# Patient Record
Sex: Female | Born: 1935 | Race: White | Hispanic: No | Marital: Married | Smoking: Never smoker
Health system: Southern US, Community
[De-identification: ages and names within clinical notes are randomized; demographics above are authoritative.]

## PROBLEM LIST (undated history)

## (undated) DIAGNOSIS — Z9221 Personal history of antineoplastic chemotherapy: Secondary | ICD-10-CM

## (undated) DIAGNOSIS — Z923 Personal history of irradiation: Secondary | ICD-10-CM

## (undated) DIAGNOSIS — C50919 Malignant neoplasm of unspecified site of unspecified female breast: Secondary | ICD-10-CM

## (undated) DIAGNOSIS — IMO0002 Reserved for concepts with insufficient information to code with codable children: Secondary | ICD-10-CM

## (undated) HISTORY — PX: BREAST LUMPECTOMY: SHX2

---

## 2004-12-31 ENCOUNTER — Ambulatory Visit: Payer: Self-pay | Admitting: Family Medicine

## 2006-01-14 ENCOUNTER — Ambulatory Visit: Payer: Self-pay | Admitting: Unknown Physician Specialty

## 2006-08-03 ENCOUNTER — Ambulatory Visit: Payer: Self-pay | Admitting: General Practice

## 2006-08-16 ENCOUNTER — Ambulatory Visit: Payer: Self-pay | Admitting: Pain Medicine

## 2006-08-23 ENCOUNTER — Ambulatory Visit: Payer: Self-pay | Admitting: Pain Medicine

## 2006-08-24 ENCOUNTER — Ambulatory Visit: Payer: Self-pay | Admitting: Pain Medicine

## 2006-09-07 ENCOUNTER — Ambulatory Visit: Payer: Self-pay | Admitting: Pain Medicine

## 2006-09-21 ENCOUNTER — Ambulatory Visit: Payer: Self-pay | Admitting: Pain Medicine

## 2006-10-05 ENCOUNTER — Ambulatory Visit: Payer: Self-pay | Admitting: Physician Assistant

## 2006-12-01 ENCOUNTER — Ambulatory Visit: Payer: Self-pay

## 2007-01-18 ENCOUNTER — Ambulatory Visit: Payer: Self-pay | Admitting: Unknown Physician Specialty

## 2007-06-09 ENCOUNTER — Ambulatory Visit: Payer: Self-pay | Admitting: General Practice

## 2007-06-09 ENCOUNTER — Other Ambulatory Visit: Payer: Self-pay

## 2007-06-22 ENCOUNTER — Ambulatory Visit: Payer: Self-pay | Admitting: General Practice

## 2008-01-19 ENCOUNTER — Ambulatory Visit: Payer: Self-pay | Admitting: Unknown Physician Specialty

## 2008-03-27 ENCOUNTER — Ambulatory Visit: Payer: Self-pay | Admitting: Family Medicine

## 2009-03-08 ENCOUNTER — Ambulatory Visit: Payer: Self-pay | Admitting: Unknown Physician Specialty

## 2009-03-19 ENCOUNTER — Ambulatory Visit: Payer: Self-pay | Admitting: Unknown Physician Specialty

## 2009-06-22 DIAGNOSIS — Z9221 Personal history of antineoplastic chemotherapy: Secondary | ICD-10-CM

## 2009-06-22 DIAGNOSIS — Z923 Personal history of irradiation: Secondary | ICD-10-CM

## 2009-06-22 DIAGNOSIS — C50919 Malignant neoplasm of unspecified site of unspecified female breast: Secondary | ICD-10-CM

## 2009-06-22 DIAGNOSIS — IMO0001 Reserved for inherently not codable concepts without codable children: Secondary | ICD-10-CM

## 2009-06-22 HISTORY — DX: Reserved for inherently not codable concepts without codable children: IMO0001

## 2009-06-22 HISTORY — DX: Personal history of antineoplastic chemotherapy: Z92.21

## 2009-06-22 HISTORY — DX: Malignant neoplasm of unspecified site of unspecified female breast: C50.919

## 2009-06-22 HISTORY — PX: BREAST BIOPSY: SHX20

## 2009-06-22 HISTORY — DX: Personal history of irradiation: Z92.3

## 2009-09-23 ENCOUNTER — Ambulatory Visit: Payer: Self-pay | Admitting: Unknown Physician Specialty

## 2009-10-03 ENCOUNTER — Ambulatory Visit: Payer: Self-pay | Admitting: Unknown Physician Specialty

## 2009-10-23 ENCOUNTER — Ambulatory Visit: Payer: Self-pay | Admitting: Surgery

## 2009-10-31 ENCOUNTER — Ambulatory Visit: Payer: Self-pay | Admitting: Surgery

## 2009-11-04 ENCOUNTER — Ambulatory Visit: Payer: Self-pay | Admitting: Surgery

## 2009-11-06 ENCOUNTER — Ambulatory Visit: Payer: Self-pay | Admitting: Cardiovascular Disease

## 2009-11-08 ENCOUNTER — Ambulatory Visit: Payer: Self-pay | Admitting: Surgery

## 2009-11-20 ENCOUNTER — Ambulatory Visit: Payer: Self-pay | Admitting: Oncology

## 2009-12-20 ENCOUNTER — Ambulatory Visit: Payer: Self-pay | Admitting: Oncology

## 2010-01-20 ENCOUNTER — Ambulatory Visit: Payer: Self-pay | Admitting: Oncology

## 2010-02-20 ENCOUNTER — Ambulatory Visit: Payer: Self-pay | Admitting: Oncology

## 2010-03-22 ENCOUNTER — Ambulatory Visit: Payer: Self-pay | Admitting: Oncology

## 2010-04-12 LAB — CANCER ANTIGEN 27.29: CA 27.29: 12.1 U/mL (ref 0.0–38.6)

## 2010-04-22 ENCOUNTER — Ambulatory Visit: Payer: Self-pay | Admitting: Oncology

## 2010-05-22 ENCOUNTER — Ambulatory Visit: Payer: Self-pay | Admitting: Oncology

## 2010-06-07 LAB — CANCER ANTIGEN 27.29: CA 27.29: 23.2 U/mL (ref 0.0–38.6)

## 2010-06-22 ENCOUNTER — Ambulatory Visit: Payer: Self-pay | Admitting: Oncology

## 2010-06-22 HISTORY — PX: BREAST BIOPSY: SHX20

## 2010-07-23 ENCOUNTER — Ambulatory Visit: Payer: Self-pay | Admitting: Oncology

## 2010-10-08 ENCOUNTER — Ambulatory Visit: Payer: Self-pay | Admitting: Surgery

## 2010-12-10 ENCOUNTER — Ambulatory Visit: Payer: Self-pay | Admitting: Oncology

## 2010-12-13 LAB — CANCER ANTIGEN 27.29: CA 27.29: 11.8 U/mL (ref 0.0–38.6)

## 2010-12-21 ENCOUNTER — Ambulatory Visit: Payer: Self-pay | Admitting: Oncology

## 2011-04-22 ENCOUNTER — Ambulatory Visit: Payer: Self-pay | Admitting: Surgery

## 2011-06-11 ENCOUNTER — Ambulatory Visit: Payer: Self-pay | Admitting: Oncology

## 2011-06-23 ENCOUNTER — Ambulatory Visit: Payer: Self-pay | Admitting: Oncology

## 2011-10-12 ENCOUNTER — Ambulatory Visit: Payer: Self-pay | Admitting: Surgery

## 2011-12-10 ENCOUNTER — Ambulatory Visit: Payer: Self-pay | Admitting: Oncology

## 2011-12-17 LAB — COMPREHENSIVE METABOLIC PANEL
Albumin: 3.7 g/dL (ref 3.4–5.0)
Bilirubin,Total: 0.3 mg/dL (ref 0.2–1.0)
Calcium, Total: 9.5 mg/dL (ref 8.5–10.1)
EGFR (African American): 40 — ABNORMAL LOW
SGOT(AST): 33 U/L (ref 15–37)
SGPT (ALT): 55 U/L
Sodium: 143 mmol/L (ref 136–145)
Total Protein: 6.7 g/dL (ref 6.4–8.2)

## 2011-12-17 LAB — CBC CANCER CENTER
Basophil %: 0.5 %
Eosinophil #: 0.1 x10 3/mm (ref 0.0–0.7)
Lymphocyte #: 1.7 x10 3/mm (ref 1.0–3.6)
Lymphocyte %: 45.4 %
MCV: 101 fL — ABNORMAL HIGH (ref 80–100)
Monocyte %: 8.7 %
Neutrophil %: 43.2 %
Platelet: 137 x10 3/mm — ABNORMAL LOW (ref 150–440)
RBC: 4.23 10*6/uL (ref 3.80–5.20)

## 2011-12-18 LAB — CANCER ANTIGEN 27.29: CA 27.29: 18.4 U/mL (ref 0.0–38.6)

## 2011-12-21 ENCOUNTER — Ambulatory Visit: Payer: Self-pay | Admitting: Oncology

## 2012-06-09 ENCOUNTER — Ambulatory Visit: Payer: Self-pay | Admitting: Oncology

## 2012-06-09 LAB — CBC CANCER CENTER
Basophil #: 0 x10 3/mm (ref 0.0–0.1)
Basophil %: 0.7 %
Eosinophil #: 0.1 x10 3/mm (ref 0.0–0.7)
Eosinophil %: 2.9 %
HGB: 14.7 g/dL (ref 12.0–16.0)
Lymphocyte %: 46.3 %
MCHC: 33.7 g/dL (ref 32.0–36.0)
Neutrophil %: 40.5 %
RBC: 4.42 10*6/uL (ref 3.80–5.20)
RDW: 13.8 % (ref 11.5–14.5)

## 2012-06-09 LAB — COMPREHENSIVE METABOLIC PANEL
Albumin: 3.8 g/dL (ref 3.4–5.0)
BUN: 18 mg/dL (ref 7–18)
Bilirubin,Total: 0.3 mg/dL (ref 0.2–1.0)
Chloride: 106 mmol/L (ref 98–107)
Co2: 29 mmol/L (ref 21–32)
Creatinine: 1.37 mg/dL — ABNORMAL HIGH (ref 0.60–1.30)
EGFR (African American): 42 — ABNORMAL LOW
Glucose: 85 mg/dL (ref 65–99)
Potassium: 4.2 mmol/L (ref 3.5–5.1)
SGOT(AST): 31 U/L (ref 15–37)
SGPT (ALT): 52 U/L (ref 12–78)
Total Protein: 7 g/dL (ref 6.4–8.2)

## 2012-06-10 LAB — CANCER ANTIGEN 27.29: CA 27.29: 24 U/mL (ref 0.0–38.6)

## 2012-06-22 ENCOUNTER — Ambulatory Visit: Payer: Self-pay | Admitting: Oncology

## 2012-10-12 ENCOUNTER — Ambulatory Visit: Payer: Self-pay | Admitting: Oncology

## 2012-11-20 ENCOUNTER — Ambulatory Visit: Payer: Self-pay | Admitting: Oncology

## 2012-12-08 LAB — COMPREHENSIVE METABOLIC PANEL
Albumin: 3.7 g/dL (ref 3.4–5.0)
Alkaline Phosphatase: 68 U/L (ref 50–136)
BUN: 21 mg/dL — ABNORMAL HIGH (ref 7–18)
Bilirubin,Total: 0.4 mg/dL (ref 0.2–1.0)
Calcium, Total: 9.9 mg/dL (ref 8.5–10.1)
Chloride: 106 mmol/L (ref 98–107)
Co2: 31 mmol/L (ref 21–32)
EGFR (Non-African Amer.): 25 — ABNORMAL LOW
Osmolality: 284 (ref 275–301)
Potassium: 4.6 mmol/L (ref 3.5–5.1)
SGPT (ALT): 49 U/L (ref 12–78)
Total Protein: 6.7 g/dL (ref 6.4–8.2)

## 2012-12-08 LAB — CBC CANCER CENTER
Basophil #: 0 x10 3/mm (ref 0.0–0.1)
HCT: 41.4 % (ref 35.0–47.0)
HGB: 14.2 g/dL (ref 12.0–16.0)
Lymphocyte #: 1.9 x10 3/mm (ref 1.0–3.6)
Lymphocyte %: 49 %
MCH: 33.6 pg (ref 26.0–34.0)
MCHC: 34.3 g/dL (ref 32.0–36.0)
MCV: 98 fL (ref 80–100)
Neutrophil #: 1.6 x10 3/mm (ref 1.4–6.5)
Neutrophil %: 40.7 %
Platelet: 144 x10 3/mm — ABNORMAL LOW (ref 150–440)
RBC: 4.23 10*6/uL (ref 3.80–5.20)
WBC: 3.9 x10 3/mm (ref 3.6–11.0)

## 2012-12-20 ENCOUNTER — Ambulatory Visit: Payer: Self-pay | Admitting: Oncology

## 2013-06-26 ENCOUNTER — Ambulatory Visit: Payer: Self-pay | Admitting: Radiation Oncology

## 2013-06-27 LAB — CBC CANCER CENTER
Basophil #: 0 x10 3/mm (ref 0.0–0.1)
Basophil %: 0.6 %
EOS ABS: 0.1 x10 3/mm (ref 0.0–0.7)
Eosinophil %: 2.1 %
HCT: 44.4 % (ref 35.0–47.0)
HGB: 14.5 g/dL (ref 12.0–16.0)
LYMPHS ABS: 1.8 x10 3/mm (ref 1.0–3.6)
LYMPHS PCT: 43.3 %
MCH: 32.6 pg (ref 26.0–34.0)
MCHC: 32.7 g/dL (ref 32.0–36.0)
MCV: 100 fL (ref 80–100)
Monocyte #: 0.3 x10 3/mm (ref 0.2–0.9)
Monocyte %: 8.3 %
NEUTROS ABS: 1.9 x10 3/mm (ref 1.4–6.5)
NEUTROS PCT: 45.7 %
Platelet: 152 x10 3/mm (ref 150–440)
RBC: 4.45 10*6/uL (ref 3.80–5.20)
RDW: 13.8 % (ref 11.5–14.5)
WBC: 4.1 x10 3/mm (ref 3.6–11.0)

## 2013-06-27 LAB — COMPREHENSIVE METABOLIC PANEL
ALK PHOS: 57 U/L
ALT: 43 U/L (ref 12–78)
Albumin: 3.6 g/dL (ref 3.4–5.0)
Anion Gap: 5 — ABNORMAL LOW (ref 7–16)
BUN: 18 mg/dL (ref 7–18)
Bilirubin,Total: 0.3 mg/dL (ref 0.2–1.0)
CO2: 31 mmol/L (ref 21–32)
Calcium, Total: 10.1 mg/dL (ref 8.5–10.1)
Chloride: 106 mmol/L (ref 98–107)
Creatinine: 1.58 mg/dL — ABNORMAL HIGH (ref 0.60–1.30)
EGFR (Non-African Amer.): 30 — ABNORMAL LOW
GFR CALC AF AMER: 35 — AB
Glucose: 96 mg/dL (ref 65–99)
Osmolality: 285 (ref 275–301)
POTASSIUM: 4.6 mmol/L (ref 3.5–5.1)
SGOT(AST): 29 U/L (ref 15–37)
Sodium: 142 mmol/L (ref 136–145)
TOTAL PROTEIN: 6.9 g/dL (ref 6.4–8.2)

## 2013-06-28 LAB — CANCER ANTIGEN 27.29: CA 27.29: 20.2 U/mL (ref 0.0–38.6)

## 2013-07-23 ENCOUNTER — Ambulatory Visit: Payer: Self-pay | Admitting: Radiation Oncology

## 2013-10-17 ENCOUNTER — Ambulatory Visit: Payer: Self-pay | Admitting: Oncology

## 2013-11-28 ENCOUNTER — Ambulatory Visit: Payer: Self-pay | Admitting: General Practice

## 2013-12-26 ENCOUNTER — Ambulatory Visit: Payer: Self-pay | Admitting: Oncology

## 2013-12-26 LAB — COMPREHENSIVE METABOLIC PANEL
ALK PHOS: 53 U/L
ALT: 37 U/L (ref 12–78)
ANION GAP: 5 — AB (ref 7–16)
Albumin: 3.8 g/dL (ref 3.4–5.0)
BUN: 19 mg/dL — AB (ref 7–18)
Bilirubin,Total: 0.4 mg/dL (ref 0.2–1.0)
CHLORIDE: 107 mmol/L (ref 98–107)
CREATININE: 1.66 mg/dL — AB (ref 0.60–1.30)
Calcium, Total: 10 mg/dL (ref 8.5–10.1)
Co2: 31 mmol/L (ref 21–32)
EGFR (African American): 33 — ABNORMAL LOW
GFR CALC NON AF AMER: 28 — AB
Glucose: 99 mg/dL (ref 65–99)
Osmolality: 287 (ref 275–301)
POTASSIUM: 4.9 mmol/L (ref 3.5–5.1)
SGOT(AST): 27 U/L (ref 15–37)
SODIUM: 143 mmol/L (ref 136–145)
Total Protein: 6.7 g/dL (ref 6.4–8.2)

## 2013-12-26 LAB — CBC CANCER CENTER
Basophil #: 0 x10 3/mm (ref 0.0–0.1)
Basophil %: 0.5 %
EOS ABS: 0.1 x10 3/mm (ref 0.0–0.7)
Eosinophil %: 2 %
HCT: 43.1 % (ref 35.0–47.0)
HGB: 14.2 g/dL (ref 12.0–16.0)
LYMPHS ABS: 2 x10 3/mm (ref 1.0–3.6)
Lymphocyte %: 50.3 %
MCH: 32.5 pg (ref 26.0–34.0)
MCHC: 33 g/dL (ref 32.0–36.0)
MCV: 98 fL (ref 80–100)
MONOS PCT: 7.6 %
Monocyte #: 0.3 x10 3/mm (ref 0.2–0.9)
Neutrophil #: 1.6 x10 3/mm (ref 1.4–6.5)
Neutrophil %: 39.6 %
Platelet: 136 x10 3/mm — ABNORMAL LOW (ref 150–440)
RBC: 4.38 10*6/uL (ref 3.80–5.20)
RDW: 14 % (ref 11.5–14.5)
WBC: 4 x10 3/mm (ref 3.6–11.0)

## 2014-01-10 DIAGNOSIS — M5136 Other intervertebral disc degeneration, lumbar region: Secondary | ICD-10-CM | POA: Insufficient documentation

## 2014-01-10 DIAGNOSIS — M48061 Spinal stenosis, lumbar region without neurogenic claudication: Secondary | ICD-10-CM | POA: Insufficient documentation

## 2014-01-10 DIAGNOSIS — M5116 Intervertebral disc disorders with radiculopathy, lumbar region: Secondary | ICD-10-CM | POA: Insufficient documentation

## 2014-01-20 ENCOUNTER — Ambulatory Visit: Payer: Self-pay | Admitting: Oncology

## 2014-04-18 ENCOUNTER — Ambulatory Visit: Payer: Self-pay | Admitting: General Practice

## 2014-04-18 LAB — SEDIMENTATION RATE: Erythrocyte Sed Rate: 12 mm/hr (ref 0–30)

## 2014-04-18 LAB — CBC
HCT: 42.1 % (ref 35.0–47.0)
HGB: 13.8 g/dL (ref 12.0–16.0)
MCH: 32.5 pg (ref 26.0–34.0)
MCHC: 32.7 g/dL (ref 32.0–36.0)
MCV: 99 fL (ref 80–100)
Platelet: 154 10*3/uL (ref 150–440)
RBC: 4.24 10*6/uL (ref 3.80–5.20)
RDW: 14 % (ref 11.5–14.5)
WBC: 4 10*3/uL (ref 3.6–11.0)

## 2014-04-18 LAB — BASIC METABOLIC PANEL
Anion Gap: 5 — ABNORMAL LOW (ref 7–16)
BUN: 14 mg/dL (ref 7–18)
CALCIUM: 9.7 mg/dL (ref 8.5–10.1)
CHLORIDE: 106 mmol/L (ref 98–107)
Co2: 32 mmol/L (ref 21–32)
Creatinine: 1.24 mg/dL (ref 0.60–1.30)
EGFR (Non-African Amer.): 44 — ABNORMAL LOW
GFR CALC AF AMER: 53 — AB
Glucose: 151 mg/dL — ABNORMAL HIGH (ref 65–99)
Osmolality: 288 (ref 275–301)
POTASSIUM: 3.8 mmol/L (ref 3.5–5.1)
SODIUM: 143 mmol/L (ref 136–145)

## 2014-04-18 LAB — URINALYSIS, COMPLETE
BILIRUBIN, UR: NEGATIVE
BLOOD: NEGATIVE
Bacteria: NONE SEEN
GLUCOSE, UR: NEGATIVE mg/dL (ref 0–75)
Ketone: NEGATIVE
Nitrite: NEGATIVE
PH: 6 (ref 4.5–8.0)
Protein: NEGATIVE
RBC,UR: <1 /HPF (ref 0–5)
SPECIFIC GRAVITY: 1.004 (ref 1.003–1.030)
Squamous Epithelial: NONE SEEN

## 2014-04-18 LAB — PROTIME-INR
INR: 1
PROTHROMBIN TIME: 12.8 s (ref 11.5–14.7)

## 2014-04-18 LAB — MRSA PCR SCREENING

## 2014-04-18 LAB — APTT: ACTIVATED PTT: 24.7 s (ref 23.6–35.9)

## 2014-04-20 LAB — URINE CULTURE

## 2014-05-02 ENCOUNTER — Inpatient Hospital Stay: Payer: Self-pay | Admitting: General Practice

## 2014-05-03 LAB — BASIC METABOLIC PANEL
Anion Gap: 6 — ABNORMAL LOW (ref 7–16)
BUN: 13 mg/dL (ref 7–18)
Calcium, Total: 8 mg/dL — ABNORMAL LOW (ref 8.5–10.1)
Chloride: 109 mmol/L — ABNORMAL HIGH (ref 98–107)
Co2: 27 mmol/L (ref 21–32)
Creatinine: 1.11 mg/dL (ref 0.60–1.30)
EGFR (African American): >60
EGFR (Non-African Amer.): 50 — ABNORMAL LOW
Glucose: 104 mg/dL — ABNORMAL HIGH (ref 65–99)
Osmolality: 284 (ref 275–301)
POTASSIUM: 4.3 mmol/L (ref 3.5–5.1)
Sodium: 142 mmol/L (ref 136–145)

## 2014-05-03 LAB — HEMOGLOBIN: HGB: 10.8 g/dL — ABNORMAL LOW (ref 12.0–16.0)

## 2014-05-03 LAB — PLATELET COUNT: Platelet: 96 10*3/uL — ABNORMAL LOW (ref 150–440)

## 2014-05-04 LAB — BASIC METABOLIC PANEL
ANION GAP: 4 — AB (ref 7–16)
BUN: 12 mg/dL (ref 7–18)
CHLORIDE: 108 mmol/L — AB (ref 98–107)
CREATININE: 1.03 mg/dL (ref 0.60–1.30)
Calcium, Total: 8.5 mg/dL (ref 8.5–10.1)
Co2: 26 mmol/L (ref 21–32)
EGFR (African American): >60
EGFR (Non-African Amer.): 54 — ABNORMAL LOW
GLUCOSE: 100 mg/dL — AB (ref 65–99)
Osmolality: 276 (ref 275–301)
POTASSIUM: 3.8 mmol/L (ref 3.5–5.1)
SODIUM: 138 mmol/L (ref 136–145)

## 2014-05-04 LAB — PLATELET COUNT: Platelet: 85 10*3/uL — ABNORMAL LOW (ref 150–440)

## 2014-05-04 LAB — HEMOGLOBIN: HGB: 10.8 g/dL — ABNORMAL LOW (ref 12.0–16.0)

## 2014-07-05 ENCOUNTER — Ambulatory Visit: Payer: Self-pay | Admitting: Oncology

## 2014-07-05 LAB — CBC CANCER CENTER
BASOS ABS: 0 x10 3/mm (ref 0.0–0.1)
BASOS PCT: 0.2 %
EOS ABS: 0.1 x10 3/mm (ref 0.0–0.7)
Eosinophil %: 1.7 %
HCT: 41.9 % (ref 35.0–47.0)
HGB: 13.8 g/dL (ref 12.0–16.0)
LYMPHS ABS: 2.2 x10 3/mm (ref 1.0–3.6)
Lymphocyte %: 51.4 %
MCH: 31.6 pg (ref 26.0–34.0)
MCHC: 32.9 g/dL (ref 32.0–36.0)
MCV: 96 fL (ref 80–100)
MONO ABS: 0.4 x10 3/mm (ref 0.2–0.9)
MONOS PCT: 8.2 %
Neutrophil #: 1.6 x10 3/mm (ref 1.4–6.5)
Neutrophil %: 38.5 %
PLATELETS: 162 x10 3/mm (ref 150–440)
RBC: 4.37 10*6/uL (ref 3.80–5.20)
RDW: 14.1 % (ref 11.5–14.5)
WBC: 4.3 x10 3/mm (ref 3.6–11.0)

## 2014-07-05 LAB — COMPREHENSIVE METABOLIC PANEL
ALBUMIN: 3.6 g/dL (ref 3.4–5.0)
Alkaline Phosphatase: 65 U/L
Anion Gap: 8 (ref 7–16)
BUN: 13 mg/dL (ref 7–18)
Bilirubin,Total: 0.3 mg/dL (ref 0.2–1.0)
CALCIUM: 9.6 mg/dL (ref 8.5–10.1)
CHLORIDE: 105 mmol/L (ref 98–107)
CREATININE: 1.4 mg/dL — AB (ref 0.60–1.30)
Co2: 30 mmol/L (ref 21–32)
EGFR (Non-African Amer.): 38 — ABNORMAL LOW
GFR CALC AF AMER: 46 — AB
Glucose: 88 mg/dL (ref 65–99)
Osmolality: 285 (ref 275–301)
Potassium: 4.1 mmol/L (ref 3.5–5.1)
SGOT(AST): 24 U/L (ref 15–37)
SGPT (ALT): 38 U/L
Sodium: 143 mmol/L (ref 136–145)
Total Protein: 7 g/dL (ref 6.4–8.2)

## 2014-07-23 ENCOUNTER — Ambulatory Visit: Payer: Self-pay | Admitting: Oncology

## 2014-10-05 ENCOUNTER — Other Ambulatory Visit: Payer: Self-pay | Admitting: Oncology

## 2014-10-05 DIAGNOSIS — M949 Disorder of cartilage, unspecified: Secondary | ICD-10-CM

## 2014-10-05 DIAGNOSIS — Z853 Personal history of malignant neoplasm of breast: Secondary | ICD-10-CM

## 2014-10-05 DIAGNOSIS — Z79891 Long term (current) use of opiate analgesic: Secondary | ICD-10-CM

## 2014-10-13 NOTE — Discharge Summary (Signed)
PATIENT NAME:  Victoria Prince, Victoria Prince MR#:  [id number] DATE OF BIRTH:  12-24-1928  DATE OF ADMISSION:  05/02/2014 DATE OF DISCHARGE:  05/05/2014  ADMITTING DIAGNOSIS: Degenerative arthrosis of the left hip.   DISCHARGE DIAGNOSIS: Degenerative arthrosis of the left hip.    HISTORY: The patient is a pleasant 81 year old female who has been followed at [facility] for progression of left hip and groin pain. She had failed conservative measurements including anti-inflammatory medications as well as assistive devices. She had noticed some decrease in her range of motion. She had noted. No injuries or any change of activities in the past, which may have led to this onset of discomfort. The patient does have some symptoms consistent with lumbar degenerative disk disease and has seen Dr. Sharlet Salina, a physiatrist, who has performed epidural steroid injections, but the symptoms have continued to persist. X-rays taken in [place] showed severe degenerative changes with full-thickness loss of the cartilage space superiorly and subchondral changes. After discussion of the risks and benefits of surgical intervention, the patient expressed her understanding of the risks and benefits, and agreed to surgical intervention. The patient states that the pain had increased to the point that it was significantly interfering with her activities of daily living.   PROCEDURE PERFORMED: Left total hip arthroplasty.   ANESTHESIA: Spinal.   IMPLANTS UTILIZED: DePuy 13.5 mm small stature AML femoral stem, a 48 mm outer diameter Pinnacle 100 acetabular component cemented, +4 mm neutral Pinnacle Marathon acetabular polyethylene liner, and a 32 mm cobalt chrome hip ball with a +1 mm neck length.   HOSPITAL COURSE: The patient tolerated the procedure very well. She had no complications. She was then taken to the PACU where she was stabilized and then transferred to the orthopedic floor. She began receiving  anticoagulation therapy of Lovenox 30 mg subcutaneous every 12 hours per anesthesia and pharmacy protocol. She was fitted with TED stockings bilaterally; these were allowed to be removed 1 hour per 8 hour shift. She was also fitted with AVI compression foot pumps set at 80 mmHg. Her calves have been nontender. There has been no evidence of any DVTs. Negative Homans sign. Heels were elevated off the bed using rolled towels.   Vital signs have been stable. She has been afebrile. Hemodynamically, she was stable. No transfusions were given. The patient was noted to be extremely nauseated the first couple of days; this was controlled with Phenergan.  Physical therapy was initiated on day 1 for gait training and transfers; this has been extremely slow. Upon being discharged, she was ambulating less than feet. Occupational therapy was also initiated on day 1 for ADLs and assistive devices.   The patient's IV, Foley and Hemovac were discontinued on day 2 along with a dressing change. The wound was free of any drainage or signs of infection.   DISPOSITION: The patient is being discharged to a skilled nursing facility in improved stable condition.   DISCHARGE INSTRUCTIONS: She may continue to weight bear as tolerated. Continue PT for gait training and transfers, OT for occupational therapy ADLs and assistive devices. I did go over the posterior hip precautions once again. She is to continue with TED stockings bilaterally. These are allowed to be removed 1 hour per 8 hour shift. Elevate the heels off the bed. Incentive spirometer q. 1 hour while awake. Encouraged cough and deep breathing q. 2 hours while awake. She is being discharged on 2 L of O2, due to some decrease OT levels in  the low 90s on 2 L and in the upper 80s without oxygen. Chest x-ray was obtained. This showed atelectasis versus possible small blunting at the left lower lobe. However, her lungs have sounded clear auscultation-wise. She has been  asymptomatic. She has been afebrile.   The patient may continue with regular diet. She was instructed in wound care. Staples will need to be removed 2 weeks postoperatively. She has a followup appointment in the [facility] on 12/24 /2015 at 9:45. She is to call the clinic sooner if she has any complications.   DISCHARGE MEDICATIONS: Anastrozole 1 mg tablet q. a.m., amitriptyline 50 mg at bedtime, Norvasc 2.5 mg at bedtime, calcium carbonate 500 mg, Vitamin D 200 units daily b.i.d., Senokot-S 1 tablet b.i.d., pantoprazole 40 mg b.i.d., Pravachol 40 mg at bedtime, Lovenox 30 mg subcutaneous q. 12 hours for 14 days then discontinue and begin taking one 81 mg enteric-coated aspirin, Tylenol ES [phone] mg q. 4 hours p.r.n., Mylanta DS 30 mL q. 6 hours p.r.n., Dulcolax suppositories 10 mg rectally p.r.n. if no results with Milk of Magnesia, Temovate 0.05% applied to affected area b.i.d., Milk of Magnesia 30 mL b.i.d., oxycodone 5 to 10 mg q. 4-6 h. p.r.n., Ultram 50-100 mg q. 4-6 hours p.r.n., enema soapsuds if no results with Milk of Magnesia or Dulcolax.   PAST MEDICAL HISTORY:  1.  Breast cancer.  2.  Hyperlipidemia.  3.  Hypertension.  4.  Arthritis.  5.  Psoriasis.  6.  Lumbar stenosis with neurogenic claudication.    DICTATED FOR: Laurice Record. Holley Bouche., MD   ____________________________ Vance Peper, PA jrw:MT D: 05/05/2014 08:16:44 ET T: 05/05/2014 14:11:07 ET JOB#: [id number]  cc: Vance Peper, PA, <Dictator> Shirelle Tootle PA ELECTRONICALLY SIGNED 05/15/2014 21:06

## 2014-10-13 NOTE — Op Note (Signed)
PATIENT NAME:  Victoria Prince, Victoria Prince MR#:  [id number] DATE OF BIRTH:  1929-03-28  DATE OF PROCEDURE:  05/02/2014  PREOPERATIVE DIAGNOSIS: Degenerative arthrosis of the left hip.   POSTOPERATIVE DIAGNOSIS: Degenerative arthrosis of the left hip.   PROCEDURE PERFORMED: Left total hip arthroplasty.   SURGEON: Skip Estimable, Prince.   ASSISTANT: Vance Peper, PA (required to maintain retraction throughout the procedure)   ANESTHESIA: Spinal.   ESTIMATED BLOOD LOSS: 300 mL.   FLUIDS REPLACED: 2100 mL of crystalloid.   DRAINS: 2 medium drains to Hemovac reservoir.   IMPLANTS UTILIZED: DePuy 13.5 mm small stature AML femoral stem, a 48-mm outer diameter Pinnacle 100 acetabular component (cemented), +4 mm neutral Pinnacle Marathon acetabular polyethylene liner and a 32 mm cobalt chrome hip ball with +1 mm neck length.   INDICATIONS FOR SURGERY: The patient is an 81 year old female who has been seen for complaints of persistent and progressive left hip and groin pain. X-rays demonstrated severe degenerative changes with full-thickness loss of articular cartilage superiorly and subchondral changes. After discussion of the risks and benefits of surgical intervention, the patient expressed understanding of the risks and benefits and agreed with plans for surgical intervention.   PROCEDURE IN DETAIL: The patient was brought into the operating room, and, after adequate spinal anesthesia was achieved, the patient was placed in the right lateral decubitus position. An axillary roll was placed, and all bony prominences were well padded. The patient's left hip and leg were cleaned and prepped with alcohol and DuraPrep and draped in the usual sterile fashion. A "timeout" was performed as per usual protocol.   A lateral curvilinear incision was made gently curving towards the posterior superior iliac spine. The IT band was incised in line with the skin incision, and the fibers of the gluteus maximus were split in line.  The piriformis tendon was identified, skeletonized and incised at its insertion at the proximal femur and reflected posteriorly. A T-type posterior capsulotomy was performed. Prior to dislocation of the femoral head, a threaded Steinmann pin was inserted through a separate stab incision into the pelvis superior to the acetabulum and then in the form of a stylus so as to assess limb length and hip offset throughout the procedure. The femoral head was then dislocated posteriorly. Severe degenerative changes were noted with prominent osteophytes and full-thickness loss of articular cartilage. A femoral neck cut was performed using an oscillating saw. The anterior capsule was elevated off the femoral neck. Inspection of the acetabulum also demonstrated significant degenerative changes. The acetabulum was reamed in a sequential fashion up to a 47-mm diameter. Good punctate bleeding bone was encountered. A 48-mm Pinnacle 100 acetabular component was positioned and impacted into place. Excellent scratch fit was appreciated. Prominent anterior osteophyte was debrided using osteotome and rongeurs. A +4 neutral polyethylene trial was inserted, and attention was directed to the proximal femur. A pilot hole for reaming of the proximal femoral canal was created using a high-speed bur. Proximal femoral canal was reamed in a sequential fashion up to a 13-mm diameter. This allowed for over 6 cm of scratch fit. A 13.5-mm aggressive side-biting reamer was used to prepare the calcar region. Serial broaches were inserted up to a 13.5-mm small stature broach. The calcar region was planed, and trial reduction was performed with a 32-mm hip ball with a +1-mm neck length. Good equalization of limb lengths was appreciated, and appropriate hip offset was noted. Excellent stability was noted both anteriorly and posteriorly. Trial components were removed. It was  elected to ream line to line for final placement of the femoral stem so a 13.5-mm  reamer was advanced by hand. A 13.5-mm small stature AML femoral component was positioned and impacted into place. A +4 neutral Pinnacle Marathon polyethylene liner had been placed and positioned. The Morse taper was cleaned and dried. A 32-mm cobalt chrome hip ball with a +1-mm neck length was placed on the trunnion and impacted into place. The hip was reduced and placed through a range of motion. Excellent stability was appreciated both anteriorly and posteriorly. Good equalization of limb lengths and good hip offset were noted.   The wound was irrigated with copious amounts of normal saline with antibiotic solution using pulsatile lavage and then suctioned dry. A posterior capsulotomy was repaired using #5 Ethibond. The piriformis tendon was reapproximated on the surface of the gluteus medius tendon using #5 Ethibond. Two medium drains were placed in the wound bed and brought out through a separate stab incision to be attached to a Hemovac reservoir. The IT band was repaired using interrupted sutures of #1 Vicryl. The subcutaneous tissue was approximated in layers using first #0 Vicryl followed by 2-0 Vicryl. Skin was closed with skin staples. A sterile dressing was applied.   The patient tolerated the procedure well. She was transported to the recovery room in stable condition.    ____________________________ Victoria Prince. Victoria Bouche., Prince jph:JT D: 05/02/2014 11:02:50 ET T: 05/02/2014 11:58:45 ET JOB#: [id number]  cc: Victoria Rinks P. Victoria Bouche., Prince, <Dictator> Victoria Prince ELECTRONICALLY SIGNED 05/10/2014 21:40

## 2014-10-15 LAB — SURGICAL PATHOLOGY

## 2014-10-23 ENCOUNTER — Ambulatory Visit
Admission: RE | Admit: 2014-10-23 | Discharge: 2014-10-23 | Disposition: A | Discharge status: Home / Self Care | Payer: Medicare HMO | Source: Ambulatory Visit | Attending: Oncology | Admitting: Oncology

## 2014-10-23 ENCOUNTER — Other Ambulatory Visit: Payer: Self-pay | Admitting: Oncology

## 2014-10-23 ENCOUNTER — Ambulatory Visit: Payer: Self-pay

## 2014-10-23 DIAGNOSIS — M8580 Other specified disorders of bone density and structure, unspecified site: Secondary | ICD-10-CM | POA: Insufficient documentation

## 2014-10-23 DIAGNOSIS — Z13820 Encounter for screening for osteoporosis: Secondary | ICD-10-CM | POA: Diagnosis present

## 2014-10-23 DIAGNOSIS — Z1231 Encounter for screening mammogram for malignant neoplasm of breast: Secondary | ICD-10-CM | POA: Insufficient documentation

## 2014-10-23 DIAGNOSIS — Z853 Personal history of malignant neoplasm of breast: Secondary | ICD-10-CM

## 2014-10-23 DIAGNOSIS — Z79891 Long term (current) use of opiate analgesic: Secondary | ICD-10-CM

## 2014-10-23 DIAGNOSIS — M949 Disorder of cartilage, unspecified: Secondary | ICD-10-CM

## 2014-10-23 HISTORY — DX: Malignant neoplasm of unspecified site of unspecified female breast: C50.919

## 2014-10-23 HISTORY — DX: Reserved for concepts with insufficient information to code with codable children: IMO0002

## 2014-10-23 HISTORY — DX: Personal history of antineoplastic chemotherapy: Z92.21

## 2014-11-11 DIAGNOSIS — Z96649 Presence of unspecified artificial hip joint: Secondary | ICD-10-CM | POA: Insufficient documentation

## 2015-01-04 ENCOUNTER — Other Ambulatory Visit: Payer: Self-pay | Admitting: *Deleted

## 2015-01-04 DIAGNOSIS — C50919 Malignant neoplasm of unspecified site of unspecified female breast: Secondary | ICD-10-CM

## 2015-01-08 ENCOUNTER — Inpatient Hospital Stay: Payer: Medicare HMO | Attending: Oncology

## 2015-01-08 ENCOUNTER — Inpatient Hospital Stay (HOSPITAL_BASED_OUTPATIENT_CLINIC_OR_DEPARTMENT_OTHER): Payer: Medicare HMO | Admitting: Oncology

## 2015-01-08 VITALS — BP 157/71 | HR 84 | Temp 96.9°F | Wt 156.7 lb

## 2015-01-08 DIAGNOSIS — Z923 Personal history of irradiation: Secondary | ICD-10-CM | POA: Insufficient documentation

## 2015-01-08 DIAGNOSIS — Z853 Personal history of malignant neoplasm of breast: Secondary | ICD-10-CM

## 2015-01-08 DIAGNOSIS — Z79899 Other long term (current) drug therapy: Secondary | ICD-10-CM | POA: Insufficient documentation

## 2015-01-08 DIAGNOSIS — C50919 Malignant neoplasm of unspecified site of unspecified female breast: Secondary | ICD-10-CM

## 2015-01-08 DIAGNOSIS — Z9221 Personal history of antineoplastic chemotherapy: Secondary | ICD-10-CM

## 2015-01-08 DIAGNOSIS — M8580 Other specified disorders of bone density and structure, unspecified site: Secondary | ICD-10-CM

## 2015-01-08 LAB — COMPREHENSIVE METABOLIC PANEL
ALBUMIN: 4.3 g/dL (ref 3.5–5.0)
ALT: 32 U/L (ref 14–54)
AST: 34 U/L (ref 15–41)
Alkaline Phosphatase: 58 U/L (ref 38–126)
Anion gap: 6 (ref 5–15)
BUN: 15 mg/dL (ref 6–20)
CO2: 30 mmol/L (ref 22–32)
Calcium: 9.6 mg/dL (ref 8.9–10.3)
Chloride: 105 mmol/L (ref 101–111)
Creatinine, Ser: 1.26 mg/dL — ABNORMAL HIGH (ref 0.44–1.00)
GFR calc Af Amer: 44 mL/min — ABNORMAL LOW (ref >60)
GFR calc non Af Amer: 38 mL/min — ABNORMAL LOW (ref >60)
Glucose, Bld: 92 mg/dL (ref 65–99)
Potassium: 4.8 mmol/L (ref 3.5–5.1)
SODIUM: 141 mmol/L (ref 135–145)
Total Bilirubin: 0.5 mg/dL (ref 0.3–1.2)
Total Protein: 7.1 g/dL (ref 6.5–8.1)

## 2015-01-08 LAB — CBC WITH DIFFERENTIAL/PLATELET
BASOS ABS: 0 10*3/uL (ref 0–0.1)
Basophils Relative: 0 %
EOS PCT: 2 %
Eosinophils Absolute: 0.1 10*3/uL (ref 0–0.7)
HCT: 44 % (ref 35.0–47.0)
Hemoglobin: 14.4 g/dL (ref 12.0–16.0)
Lymphocytes Relative: 49 %
Lymphs Abs: 2 10*3/uL (ref 1.0–3.6)
MCH: 31.8 pg (ref 26.0–34.0)
MCHC: 32.7 g/dL (ref 32.0–36.0)
MCV: 97.2 fL (ref 80.0–100.0)
Monocytes Absolute: 0.4 10*3/uL (ref 0.2–0.9)
Monocytes Relative: 9 %
NEUTROS PCT: 40 %
Neutro Abs: 1.6 10*3/uL (ref 1.4–6.5)
Platelets: 165 10*3/uL (ref 150–440)
RBC: 4.52 MIL/uL (ref 3.80–5.20)
RDW: 14.9 % — ABNORMAL HIGH (ref 11.5–14.5)
WBC: 4 10*3/uL (ref 3.6–11.0)

## 2015-01-08 NOTE — Progress Notes (Signed)
Patient does not have living will.  Never smoked. 

## 2015-01-12 ENCOUNTER — Encounter: Payer: Self-pay | Admitting: Oncology

## 2015-01-12 DIAGNOSIS — C50919 Malignant neoplasm of unspecified site of unspecified female breast: Secondary | ICD-10-CM | POA: Insufficient documentation

## 2015-01-12 DIAGNOSIS — Z170 Estrogen receptor positive status [ER+]: Secondary | ICD-10-CM | POA: Insufficient documentation

## 2015-01-12 NOTE — Progress Notes (Signed)
[place] @ [facility] [phone]  Fax:([phone]) [place]: [birth year]  MR#: [id number]  QMG#:[id number]  No care team member to display  CHIEF COMPLAINT:  Chief Complaint  Patient presents with  . Follow-up    Oncology History   1. Carcinoma of breast status post lumpectomyestrogen receptor positive, progesterone receptor positive.  Positive margin with ductal carcinoma in situ. 2. Oncotype DX score is 28 with recurring score is high in between 14%-22% on average 18 %. 3. Cytoxan and Taxotere for 4 cycle 4. Followed by radiation therapy finished in December of 2011  5. Started on Femara.  December of 2011 6. Changed to Arimidex (insurance request) 7/finishing Arimidex  in May of 2016 HPI:        Cancer of breast    No flowsheet data found.  INTERVAL HISTORY: \ 81 year old lady came today further follow-up regarding carcinoma of breast patient has finished anastrozole in May of 2016.  No chills.  No fever.  No bony pain.  No bony fractures reported.  Getting regular mammograms done.  REVIEW OF SYSTEMS:    general status: Patient is feeling weak and tired.  No change in a performance status.  No chills.  No fever. HEENT: .  No evidence of stomatitis Lungs: No cough or shortness of breath Cardiac: No chest pain or paroxysmal nocturnal dyspnea GI: No nausea no vomiting no diarrhea no abdominal pain Skin: No rash Lower extremity no swelling Neurological system: No tingling.  No numbness.  No other focal signs Musculoskeletal system no bony pains  As per HPI. Otherwise, a complete review of systems is negatve.  PAST MEDICAL HISTORY: Past Medical History  Diagnosis Date  . Breast cancer 2011    left breast  . Radiation 2011  . S/P chemotherapy, time since greater than 12 weeks 2011  . Cancer of breast 01/12/2015    PAST SURGICAL HISTORY: Past Surgical History  Procedure Laterality Date  . Breast biopsy Left 2012    negative    Allergies:  Sulfa drugs: GI Distress  Significant History/PMH:   back problems:    hypertension:    Cataracts, bilateral:    breast biopsy:    knee:   Preventive Screening:  Has patient had any of the following test? Mammography (1)   Last Mammography: April 20161)   Smoking History: Smoking History Never Smoked.(1)  PFSH: Social History: negative alcohol, negative tobacco  Additional Past Medical and Surgical History: system review from multiple previous notes    HEALTH MAINTENANCE: History  Substance Use Topics  . Smoking status: Never Smoker   . Smokeless tobacco: Not on file  . Alcohol Use: Not on file      Allergies  Allergen Reactions  . Sulfa Antibiotics Rash    Current Outpatient Prescriptions  Medication Sig Dispense Refill  . amitriptyline (ELAVIL) 25 MG tablet Take by mouth.    Marland Kitchen amLODipine (NORVASC) 2.5 MG tablet Take by mouth.    . Calcium Carbonate-Vitamin D (CALCIUM 600+D) 600-200 MG-UNIT TABS Take by mouth.    . clobetasol cream (TEMOVATE) 3.26 % Apply 1 Application topically as needed.     . docusate sodium (COLACE) 50 MG capsule Take by mouth.    . pravastatin (PRAVACHOL) 40 MG tablet Take by mouth.     No current facility-administered medications for this visit.    OBJECTIVE:  Filed Vitals:   01/08/15 1056  BP: 157/71  Pulse: 84  Temp: 96.9 F (36.1 C)  There is no height on file to calculate BMI.    ECOG FS:1 - Symptomatic but completely ambulatory  PHYSICAL EXAM: GENERAL:  Well developed, well nourished, sitting comfortably in the exam room in no acute distress. MENTAL STATUS:  Alert and oriented to person, place and time. . ENT:  Oropharynx clear without lesion.  Tongue normal. Mucous membranes moist.  RESPIRATORY:  Clear to auscultation without rales, wheezes or rhonchi. CARDIOVASCULAR:  Regular rate and rhythm without murmur, rub or gallop. BREAST:  Right breast without masses, skin changes or nipple discharge.   Left breast without masses, skin changes or nipple discharge. ABDOMEN:  Soft, non-tender, with active bowel sounds, and no hepatosplenomegaly.  No masses. BACK:  No CVA tenderness.  No tenderness on percussion of the back or rib cage. SKIN:  No rashes, ulcers or lesions. EXTREMITIES: No edema, no skin discoloration or tenderness.  No palpable cords. LYMPH NODES: No palpable cervical, supraclavicular, axillary or inguinal adenopathy  NEUROLOGICAL: Unremarkable. PSYCH:  Appropriate.   LAB RESULTS:  Appointment on 01/08/2015  Component Date Value Ref Range Status  . WBC 01/08/2015 4.0  3.6 - 11.0 K/uL Final  . RBC 01/08/2015 4.52  3.80 - 5.20 MIL/uL Final  . Hemoglobin 01/08/2015 14.4  12.0 - 16.0 g/dL Final  . HCT 01/08/2015 44.0  35.0 - 47.0 % Final  . MCV 01/08/2015 97.2  80.0 - 100.0 fL Final  . MCH 01/08/2015 31.8  26.0 - 34.0 pg Final  . MCHC 01/08/2015 32.7  32.0 - 36.0 g/dL Final  . RDW 01/08/2015 14.9* 11.5 - 14.5 % Final  . Platelets 01/08/2015 165  150 - 440 K/uL Final  . Neutrophils Relative % 01/08/2015 40   Final  . Neutro Abs 01/08/2015 1.6  1.4 - 6.5 K/uL Final  . Lymphocytes Relative 01/08/2015 49   Final  . Lymphs Abs 01/08/2015 2.0  1.0 - 3.6 K/uL Final  . Monocytes Relative 01/08/2015 9   Final  . Monocytes Absolute 01/08/2015 0.4  0.2 - 0.9 K/uL Final  . Eosinophils Relative 01/08/2015 2   Final  . Eosinophils Absolute 01/08/2015 0.1  0 - 0.7 K/uL Final  . Basophils Relative 01/08/2015 0   Final  . Basophils Absolute 01/08/2015 0.0  0 - 0.1 K/uL Final  . Sodium 01/08/2015 141  135 - 145 mmol/L Final  . Potassium 01/08/2015 4.8  3.5 - 5.1 mmol/L Final  . Chloride 01/08/2015 105  101 - 111 mmol/L Final  . CO2 01/08/2015 30  22 - 32 mmol/L Final  . Glucose, Bld 01/08/2015 92  65 - 99 mg/dL Final  . BUN 01/08/2015 15  6 - 20 mg/dL Final  . Creatinine, Ser 01/08/2015 1.26* 0.44 - 1.00 mg/dL Final  . Calcium 01/08/2015 9.6  8.9 - 10.3 mg/dL Final  . Total Protein  01/08/2015 7.1  6.5 - 8.1 g/dL Final  . Albumin 01/08/2015 4.3  3.5 - 5.0 g/dL Final  . AST 01/08/2015 34  15 - 41 U/L Final  . ALT 01/08/2015 32  14 - 54 U/L Final  . Alkaline Phosphatase 01/08/2015 58  38 - 126 U/L Final  . Total Bilirubin 01/08/2015 0.5  0.3 - 1.2 mg/dL Final  . GFR calc non Af Amer 01/08/2015 38* >60 mL/min Final  . GFR calc Af Amer 01/08/2015 44* >60 mL/min Final   Comment: (NOTE) The eGFR has been calculated using the CKD EPI equation. This calculation has not been validated in all clinical situations. eGFR's persistently <60 mL/min  signify possible Chronic Kidney Disease.   Georgiann Hahn gap 01/08/2015 6  5 - 15 Final   Mammogram in May of 2016 FINDINGS: No suspicious masses or calcifications are seen in either breast. Postsurgical changes are again seen in the upper-outer posterior left breast related to prior lumpectomy. Spot compression magnification CC view of the lumpectomy site in the left breast was performed. There is no mammographic evidence of locally recurrent malignancy.  Mammographic images were processed with CAD.  IMPRESSION: No mammographic evidence of malignancy in either breast.  RECOMMENDATION: Diagnostic mammogram is suggested in 1 year. (Code:DM-B-01Y)  I have discussed the findings and recommendations with the patient. Results were also provided in writing at the conclusion of the visit. If applicable, a reminder letter will be sent to the patient regarding the next appointment.  BI-RADS CATEGORY 2: Benign.   ASSESSMENT: 1. Carcinoma of the left breast- stage I with highOncotype DX.  Treated with chemotherapy. on Arimidex since December 2011 Patient has finished anastrozole in May of 2016   2. chronic back pain and hip pain followed by orthopedics  3. osteopenia on calcium and vitamin D supplementation  MEDICAL DECISION MAKING:  Continue follow-up.  Repeat mammogram in April. There is no clinical evidence of recurrent or  progressive disease.  Considering patient's old age and do not see any value in extended adjuvant therapy particularly patient also has osteoporosis and hip fracture  Patient expressed understanding and was in agreement with this plan. She also understands that She can call clinic at any time with any questions, concerns, or complaints.    No matching staging information was found for the patient.  Forest Gleason, MD   01/12/2015 7:32 AM

## 2015-08-08 DIAGNOSIS — Z96642 Presence of left artificial hip joint: Secondary | ICD-10-CM | POA: Diagnosis not present

## 2015-08-08 DIAGNOSIS — M25561 Pain in right knee: Secondary | ICD-10-CM | POA: Diagnosis not present

## 2015-08-08 DIAGNOSIS — G8929 Other chronic pain: Secondary | ICD-10-CM | POA: Diagnosis not present

## 2015-08-08 DIAGNOSIS — M1711 Unilateral primary osteoarthritis, right knee: Secondary | ICD-10-CM | POA: Diagnosis not present

## 2015-10-21 DIAGNOSIS — H6123 Impacted cerumen, bilateral: Secondary | ICD-10-CM | POA: Diagnosis not present

## 2015-10-21 DIAGNOSIS — Z1389 Encounter for screening for other disorder: Secondary | ICD-10-CM | POA: Diagnosis not present

## 2015-10-21 DIAGNOSIS — Z0001 Encounter for general adult medical examination with abnormal findings: Secondary | ICD-10-CM | POA: Diagnosis not present

## 2015-10-21 DIAGNOSIS — I10 Essential (primary) hypertension: Secondary | ICD-10-CM | POA: Diagnosis not present

## 2015-10-21 DIAGNOSIS — F4322 Adjustment disorder with anxiety: Secondary | ICD-10-CM | POA: Diagnosis not present

## 2015-10-21 DIAGNOSIS — E782 Mixed hyperlipidemia: Secondary | ICD-10-CM | POA: Diagnosis not present

## 2015-10-21 DIAGNOSIS — K219 Gastro-esophageal reflux disease without esophagitis: Secondary | ICD-10-CM | POA: Diagnosis not present

## 2015-10-23 ENCOUNTER — Other Ambulatory Visit: Payer: Self-pay | Admitting: Oncology

## 2015-10-23 ENCOUNTER — Ambulatory Visit
Admission: RE | Admit: 2015-10-23 | Discharge: 2015-10-23 | Disposition: A | Discharge status: Home / Self Care | Payer: PPO | Source: Ambulatory Visit | Attending: Oncology | Admitting: Oncology

## 2015-10-23 DIAGNOSIS — Z853 Personal history of malignant neoplasm of breast: Secondary | ICD-10-CM | POA: Diagnosis not present

## 2015-10-23 DIAGNOSIS — C50919 Malignant neoplasm of unspecified site of unspecified female breast: Secondary | ICD-10-CM

## 2015-10-23 DIAGNOSIS — R928 Other abnormal and inconclusive findings on diagnostic imaging of breast: Secondary | ICD-10-CM | POA: Diagnosis not present

## 2015-12-04 DIAGNOSIS — F4322 Adjustment disorder with anxiety: Secondary | ICD-10-CM | POA: Diagnosis not present

## 2015-12-04 DIAGNOSIS — Z6829 Body mass index (BMI) 29.0-29.9, adult: Secondary | ICD-10-CM | POA: Diagnosis not present

## 2015-12-04 DIAGNOSIS — R600 Localized edema: Secondary | ICD-10-CM | POA: Diagnosis not present

## 2016-01-09 ENCOUNTER — Other Ambulatory Visit: Payer: Medicare HMO

## 2016-01-09 ENCOUNTER — Ambulatory Visit: Payer: Medicare HMO | Admitting: Oncology

## 2016-01-17 ENCOUNTER — Other Ambulatory Visit: Payer: Self-pay | Admitting: *Deleted

## 2016-01-17 DIAGNOSIS — Z853 Personal history of malignant neoplasm of breast: Secondary | ICD-10-CM

## 2016-01-20 ENCOUNTER — Inpatient Hospital Stay: Payer: PPO | Attending: Internal Medicine | Admitting: Internal Medicine

## 2016-01-20 ENCOUNTER — Inpatient Hospital Stay: Payer: PPO

## 2016-01-20 DIAGNOSIS — Z170 Estrogen receptor positive status [ER+]: Secondary | ICD-10-CM

## 2016-01-20 DIAGNOSIS — Z79899 Other long term (current) drug therapy: Secondary | ICD-10-CM | POA: Diagnosis not present

## 2016-01-20 DIAGNOSIS — C50412 Malignant neoplasm of upper-outer quadrant of left female breast: Secondary | ICD-10-CM | POA: Diagnosis not present

## 2016-01-20 DIAGNOSIS — Z79811 Long term (current) use of aromatase inhibitors: Secondary | ICD-10-CM | POA: Insufficient documentation

## 2016-01-20 DIAGNOSIS — Z9221 Personal history of antineoplastic chemotherapy: Secondary | ICD-10-CM | POA: Insufficient documentation

## 2016-01-20 DIAGNOSIS — M8580 Other specified disorders of bone density and structure, unspecified site: Secondary | ICD-10-CM | POA: Diagnosis not present

## 2016-01-20 DIAGNOSIS — Z923 Personal history of irradiation: Secondary | ICD-10-CM | POA: Diagnosis not present

## 2016-01-20 DIAGNOSIS — Z853 Personal history of malignant neoplasm of breast: Secondary | ICD-10-CM

## 2016-01-20 LAB — CBC WITH DIFFERENTIAL/PLATELET
BASOS ABS: 0 10*3/uL (ref 0–0.1)
BASOS PCT: 1 %
EOS PCT: 2 %
Eosinophils Absolute: 0.1 10*3/uL (ref 0–0.7)
HCT: 43.9 % (ref 35.0–47.0)
Hemoglobin: 15.3 g/dL (ref 12.0–16.0)
LYMPHS PCT: 48 %
Lymphs Abs: 2 10*3/uL (ref 1.0–3.6)
MCH: 32.6 pg (ref 26.0–34.0)
MCHC: 34.8 g/dL (ref 32.0–36.0)
MCV: 93.8 fL (ref 80.0–100.0)
MONO ABS: 0.4 10*3/uL (ref 0.2–0.9)
Monocytes Relative: 9 %
Neutro Abs: 1.6 10*3/uL (ref 1.4–6.5)
Neutrophils Relative %: 40 %
PLATELETS: 149 10*3/uL — AB (ref 150–440)
RBC: 4.68 MIL/uL (ref 3.80–5.20)
RDW: 14.5 % (ref 11.5–14.5)
WBC: 4.1 10*3/uL (ref 3.6–11.0)

## 2016-01-20 LAB — COMPREHENSIVE METABOLIC PANEL
ALBUMIN: 4.3 g/dL (ref 3.5–5.0)
ALT: 34 U/L (ref 14–54)
AST: 38 U/L (ref 15–41)
Alkaline Phosphatase: 51 U/L (ref 38–126)
Anion gap: 6 (ref 5–15)
BUN: 21 mg/dL — ABNORMAL HIGH (ref 6–20)
CHLORIDE: 107 mmol/L (ref 101–111)
CO2: 26 mmol/L (ref 22–32)
CREATININE: 1.42 mg/dL — AB (ref 0.44–1.00)
Calcium: 9.3 mg/dL (ref 8.9–10.3)
GFR calc Af Amer: 38 mL/min — ABNORMAL LOW (ref >60)
GFR, EST NON AFRICAN AMERICAN: 33 mL/min — AB (ref >60)
GLUCOSE: 91 mg/dL (ref 65–99)
Potassium: 4.2 mmol/L (ref 3.5–5.1)
SODIUM: 139 mmol/L (ref 135–145)
Total Bilirubin: 0.8 mg/dL (ref 0.3–1.2)
Total Protein: 6.9 g/dL (ref 6.5–8.1)

## 2016-01-20 NOTE — Progress Notes (Signed)
[place] OFFICE PROGRESS NOTE  Patient Care Team: Lynnell Jude, MD as PCP - General (Family Medicine)  No matching staging information was found for the patient.   Oncology History   1. Carcinoma of breast status post lumpectomyestrogen receptor positive, progesterone receptor positive.  Positive margin with ductal carcinoma in situ. [Dr.Smith] 2. Oncotype DX score is 28 with recurring score is high in between 14%-22% on average 18 %. 3. Cytoxan and Taxotere for 4 cycle 4. Followed by radiation therapy finished in December of 2011  5. Started on Femara.  December of 2011 6. Changed to Arimidex (insurance request) 7/finishing Arimidex  in May of 2016        Cancer of breast [facility])    Malignant neoplasm of upper-outer quadrant of left female breast ([place])   01/20/2016 Initial Diagnosis    Malignant neoplasm of upper-outer quadrant of left female breast [facility])     This is my first interaction with the patient as patient's primary oncologist has been Dr.Choksi. I reviewed the patient's prior charts/pertinent labs/imaging in detail; findings are summarized above.     INTERVAL HISTORY:  Victoria Prince 82 y.o.  female pleasant patient above history of Early stage breast cancer currently on surveillance visit for follow-up. Patient denies any new lumps or bumps. Appetite is good. No weight loss headaches or bone pain. No nausea no vomiting chest pain shortness of breath or cough.  REVIEW OF SYSTEMS:  A complete 10 point review of system is done which is negative except mentioned above/history of present illness.   PAST MEDICAL HISTORY :  Past Medical History:  Diagnosis Date  . Breast cancer ([place]) 2011   left breast lumpectomy with rad and chemo tx  . Radiation 2011  . S/P chemotherapy, time since greater than 12 weeks 2011    PAST SURGICAL HISTORY :   Past Surgical History:  Procedure Laterality Date  . BREAST BIOPSY Left 2012   negative  . BREAST BIOPSY  Left 2011   positive    FAMILY HISTORY :  No family history on file.  SOCIAL HISTORY:   Social History  Substance Use Topics  . Smoking status: Never Smoker  . Smokeless tobacco: Not on file  . Alcohol use Not on file    ALLERGIES:  is allergic to sulfa antibiotics.  MEDICATIONS:  Current Outpatient Prescriptions  Medication Sig Dispense Refill  . amitriptyline (ELAVIL) 25 MG tablet Take by mouth.    Marland Kitchen amLODipine (NORVASC) 2.5 MG tablet Take by mouth.    . clobetasol cream (TEMOVATE) [id number] % Apply 1 Application topically as needed.     . docusate sodium (COLACE) 50 MG capsule Take by mouth.    . pravastatin (PRAVACHOL) 40 MG tablet Take by mouth.     No current facility-administered medications for this visit.     PHYSICAL EXAMINATION: ECOG PERFORMANCE STATUS: 0 - Asymptomatic  BP (!) 146/83 (BP Location: Left Arm, Patient Position: Sitting)   Pulse (!) 105   Temp 97.6 F (36.4 C) (Tympanic)   Resp 18   Wt 160 lb 5 oz (72.7 kg)   Filed Weights   01/20/16 1019  Weight: 160 lb 5 oz (72.7 kg)    GENERAL: Well-nourished well-developed; Alert, no distress and comfortable.   Alone.  EYES: no pallor or icterus OROPHARYNX: no thrush or ulceration; good dentition  NECK: supple, no masses felt LYMPH:  no palpable lymphadenopathy in the cervical, axillary or inguinal regions LUNGS: clear to auscultation  and  No wheeze or crackles HEART/CVS: regular rate & rhythm and no murmurs; No lower extremity edema ABDOMEN:abdomen soft, non-tender and normal bowel sounds Musculoskeletal:no cyanosis of digits and no clubbing  PSYCH: alert & oriented x 3 with fluent speech NEURO: no focal motor/sensory deficits SKIN:  no rashes or significant lesions  LABORATORY DATA:  I have reviewed the data as listed    Component Value Date/Time   NA 139 01/20/2016 0848   NA 143 07/05/2014 0859   K 4.2 01/20/2016 0848   K 4.1 07/05/2014 0859   CL 107 01/20/2016 0848   CL 105 07/05/2014 0859    CO2 26 01/20/2016 0848   CO2 30 07/05/2014 0859   GLUCOSE 91 01/20/2016 0848   GLUCOSE 88 07/05/2014 0859   BUN 21 (H) 01/20/2016 0848   BUN 13 07/05/2014 0859   CREATININE 1.42 (H) 01/20/2016 0848   CREATININE 1.40 (H) 07/05/2014 0859   CALCIUM 9.3 01/20/2016 0848   CALCIUM 9.6 07/05/2014 0859   PROT 6.9 01/20/2016 0848   PROT 7.0 07/05/2014 0859   ALBUMIN 4.3 01/20/2016 0848   ALBUMIN 3.6 07/05/2014 0859   AST 38 01/20/2016 0848   AST 24 07/05/2014 0859   ALT 34 01/20/2016 0848   ALT 38 07/05/2014 0859   ALKPHOS 51 01/20/2016 0848   ALKPHOS 65 07/05/2014 0859   BILITOT 0.8 01/20/2016 0848   BILITOT 0.3 07/05/2014 0859   GFRNONAA 33 (L) 01/20/2016 0848   GFRNONAA 38 (L) 07/05/2014 0859   GFRNONAA 28 (L) 12/26/2013 1001   GFRAA 38 (L) 01/20/2016 0848   GFRAA 46 (L) 07/05/2014 0859   GFRAA 33 (L) 12/26/2013 1001    No results found for: SPEP, UPEP  Lab Results  Component Value Date   WBC 4.1 01/20/2016   NEUTROABS 1.6 01/20/2016   HGB 15.3 01/20/2016   HCT 43.9 01/20/2016   MCV 93.8 01/20/2016   PLT 149 (L) 01/20/2016      Chemistry      Component Value Date/Time   NA 139 01/20/2016 0848   NA 143 07/05/2014 0859   K 4.2 01/20/2016 0848   K 4.1 07/05/2014 0859   CL 107 01/20/2016 0848   CL 105 07/05/2014 0859   CO2 26 01/20/2016 0848   CO2 30 07/05/2014 0859   BUN 21 (H) 01/20/2016 0848   BUN 13 07/05/2014 0859   CREATININE 1.42 (H) 01/20/2016 0848   CREATININE 1.40 (H) 07/05/2014 0859      Component Value Date/Time   CALCIUM 9.3 01/20/2016 0848   CALCIUM 9.6 07/05/2014 0859   ALKPHOS 51 01/20/2016 0848   ALKPHOS 65 07/05/2014 0859   AST 38 01/20/2016 0848   AST 24 07/05/2014 0859   ALT 34 01/20/2016 0848   ALT 38 07/05/2014 0859   BILITOT 0.8 01/20/2016 0848   BILITOT 0.3 07/05/2014 0859       RADIOGRAPHIC STUDIES: I have personally reviewed the radiological images as listed and agreed with the findings in the report. No results found.    ASSESSMENT & PLAN:  Malignant neoplasm of upper-outer quadrant of left female breast ([place]) # Breast ca er/pr- pos; her 2 neu neg; high risk oncotype s/p TC [2012] s/p arimidex. Mammogram may 2017- NEG. Clinically no NED.   # OSTEOPENIA- may 2016- BMD. Recommend ca+ vit D  # CKD- 1.4 creatinine- monitor for now.  # follow up labs/ MD/ [place]/mammogram/Dexa [May [id number] scan in 1 year.    Orders Placed This Encounter  Procedures  .  US Breast Complete Uni Left Inc Axilla    Standing Status:   Future    Standing Expiration Date:   03/21/2017    Order Specific Question:   Reason for Exam (SYMPTOM  OR DIAGNOSIS REQUIRED)    Answer:   Status post left lumpectomy/radiation therapy. left breast cancer    Order Specific Question:   Preferred imaging location?    Answer:   [facility]  . US Breast Complete Uni Right Inc Axilla    Standing Status:   Future    Standing Expiration Date:   03/21/2017    Order Specific Question:   Reason for Exam (SYMPTOM  OR DIAGNOSIS REQUIRED)    Answer:   Status post left lumpectomy/radiation therapy. left breast cancer    Order Specific Question:   Preferred imaging location?    Answer:   [facility]  . MM Digital Diagnostic Bilat    Standing Status:   Future    Standing Expiration Date:   01/19/2017    Order Specific Question:   Reason for Exam (SYMPTOM  OR DIAGNOSIS REQUIRED)    Answer:   Status post left lumpectomy/radiation therapy. left breast cancer    Order Specific Question:   Preferred imaging location?    Answer:   [facility]  . DG Bone Density    Standing Status:   Future    Standing Expiration Date:   01/19/2017    Order Specific Question:   Reason for Exam (SYMPTOM  OR DIAGNOSIS REQUIRED)    Answer:   Status post left lumpectomy/radiation therapy. left breast cancer    Order Specific Question:   Preferred imaging location?    Answer:   [facility]  . Comprehensive metabolic panel    Standing Status:   Future     Standing Expiration Date:   07/22/2017  . CBC with Differential    Standing Status:   Future    Standing Expiration Date:   07/22/2017   All questions were answered. The patient knows to call the clinic with any problems, questions or concerns.      Cammie Sickle, MD 01/22/2016 6:34 PM

## 2016-01-20 NOTE — Assessment & Plan Note (Signed)
#   Breast ca er/pr- pos; her 2 neu neg; high risk oncotype s/p TC [2012] s/p arimidex. Mammogram may 2017- NEG. Clinically no NED.   # OSTEOPENIA- may 2016- BMD. Recommend ca+ vit D  # CKD- 1.4 creatinine- monitor for now.  # follow up labs/ MD/ [place]/mammogram/Dexa [May [id number] scan in 1 year.

## 2016-08-26 DIAGNOSIS — H1013 Acute atopic conjunctivitis, bilateral: Secondary | ICD-10-CM | POA: Diagnosis not present

## 2016-09-10 DIAGNOSIS — H26491 Other secondary cataract, right eye: Secondary | ICD-10-CM | POA: Diagnosis not present

## 2016-10-26 ENCOUNTER — Ambulatory Visit
Admission: RE | Admit: 2016-10-26 | Discharge: 2016-10-26 | Disposition: A | Discharge status: Home / Self Care | Payer: PPO | Source: Ambulatory Visit | Attending: Internal Medicine | Admitting: Internal Medicine

## 2016-10-26 DIAGNOSIS — C50412 Malignant neoplasm of upper-outer quadrant of left female breast: Secondary | ICD-10-CM

## 2016-10-26 DIAGNOSIS — Z853 Personal history of malignant neoplasm of breast: Secondary | ICD-10-CM | POA: Diagnosis not present

## 2016-10-26 DIAGNOSIS — M85861 Other specified disorders of bone density and structure, right lower leg: Secondary | ICD-10-CM | POA: Insufficient documentation

## 2016-10-26 DIAGNOSIS — Z923 Personal history of irradiation: Secondary | ICD-10-CM | POA: Diagnosis not present

## 2016-10-26 DIAGNOSIS — M810 Age-related osteoporosis without current pathological fracture: Secondary | ICD-10-CM | POA: Insufficient documentation

## 2016-10-26 DIAGNOSIS — M8588 Other specified disorders of bone density and structure, other site: Secondary | ICD-10-CM | POA: Insufficient documentation

## 2016-11-11 ENCOUNTER — Telehealth: Payer: Self-pay | Admitting: Obstetrics & Gynecology

## 2016-11-11 NOTE — Telephone Encounter (Signed)
[facility] is faxing a request for medication refill for Triamcinolone 0.5% cream. Prescription is not authorized due to Dr. Leonides Schanz no longer with [facility]. Pt needs to schedule appt.

## 2016-12-24 DIAGNOSIS — K219 Gastro-esophageal reflux disease without esophagitis: Secondary | ICD-10-CM | POA: Diagnosis not present

## 2016-12-24 DIAGNOSIS — I10 Essential (primary) hypertension: Secondary | ICD-10-CM | POA: Diagnosis not present

## 2016-12-24 DIAGNOSIS — Z79899 Other long term (current) drug therapy: Secondary | ICD-10-CM | POA: Diagnosis not present

## 2016-12-24 DIAGNOSIS — Z0001 Encounter for general adult medical examination with abnormal findings: Secondary | ICD-10-CM | POA: Diagnosis not present

## 2016-12-24 DIAGNOSIS — E782 Mixed hyperlipidemia: Secondary | ICD-10-CM | POA: Diagnosis not present

## 2016-12-24 DIAGNOSIS — G2581 Restless legs syndrome: Secondary | ICD-10-CM | POA: Diagnosis not present

## 2016-12-24 DIAGNOSIS — F4322 Adjustment disorder with anxiety: Secondary | ICD-10-CM | POA: Diagnosis not present

## 2016-12-24 DIAGNOSIS — M160 Bilateral primary osteoarthritis of hip: Secondary | ICD-10-CM | POA: Diagnosis not present

## 2016-12-24 DIAGNOSIS — Z131 Encounter for screening for diabetes mellitus: Secondary | ICD-10-CM | POA: Diagnosis not present

## 2017-01-20 ENCOUNTER — Inpatient Hospital Stay: Payer: PPO

## 2017-01-20 ENCOUNTER — Inpatient Hospital Stay: Payer: PPO | Attending: Internal Medicine | Admitting: Internal Medicine

## 2017-01-20 VITALS — BP 138/80 | HR 46 | Temp 98.2°F | Resp 20 | Ht 61.5 in | Wt 158.4 lb

## 2017-01-20 DIAGNOSIS — Z79811 Long term (current) use of aromatase inhibitors: Secondary | ICD-10-CM | POA: Insufficient documentation

## 2017-01-20 DIAGNOSIS — Z170 Estrogen receptor positive status [ER+]: Secondary | ICD-10-CM | POA: Diagnosis not present

## 2017-01-20 DIAGNOSIS — N189 Chronic kidney disease, unspecified: Secondary | ICD-10-CM

## 2017-01-20 DIAGNOSIS — M810 Age-related osteoporosis without current pathological fracture: Secondary | ICD-10-CM | POA: Diagnosis not present

## 2017-01-20 DIAGNOSIS — Z9221 Personal history of antineoplastic chemotherapy: Secondary | ICD-10-CM | POA: Insufficient documentation

## 2017-01-20 DIAGNOSIS — Z923 Personal history of irradiation: Secondary | ICD-10-CM | POA: Diagnosis not present

## 2017-01-20 DIAGNOSIS — C50412 Malignant neoplasm of upper-outer quadrant of left female breast: Secondary | ICD-10-CM | POA: Diagnosis not present

## 2017-01-20 LAB — CBC WITH DIFFERENTIAL/PLATELET
BASOS ABS: 0 10*3/uL (ref 0–0.1)
BASOS PCT: 1 %
EOS ABS: 0 10*3/uL (ref 0–0.7)
EOS PCT: 1 %
HCT: 44.4 % (ref 35.0–47.0)
Hemoglobin: 15 g/dL (ref 12.0–16.0)
Lymphocytes Relative: 47 %
Lymphs Abs: 2.4 10*3/uL (ref 1.0–3.6)
MCH: 32.7 pg (ref 26.0–34.0)
MCHC: 33.9 g/dL (ref 32.0–36.0)
MCV: 96.4 fL (ref 80.0–100.0)
MONO ABS: 0.4 10*3/uL (ref 0.2–0.9)
MONOS PCT: 7 %
Neutro Abs: 2.2 10*3/uL (ref 1.4–6.5)
Neutrophils Relative %: 44 %
PLATELETS: 170 10*3/uL (ref 150–440)
RBC: 4.6 MIL/uL (ref 3.80–5.20)
RDW: 13.9 % (ref 11.5–14.5)
WBC: 4.9 10*3/uL (ref 3.6–11.0)

## 2017-01-20 LAB — COMPREHENSIVE METABOLIC PANEL
ALBUMIN: 4.5 g/dL (ref 3.5–5.0)
ALK PHOS: 47 U/L (ref 38–126)
ALT: 38 U/L (ref 14–54)
AST: 43 U/L — ABNORMAL HIGH (ref 15–41)
Anion gap: 8 (ref 5–15)
BILIRUBIN TOTAL: 0.7 mg/dL (ref 0.3–1.2)
BUN: 14 mg/dL (ref 6–20)
CALCIUM: 10.1 mg/dL (ref 8.9–10.3)
CO2: 27 mmol/L (ref 22–32)
CREATININE: 1.15 mg/dL — AB (ref 0.44–1.00)
Chloride: 106 mmol/L (ref 101–111)
GFR calc Af Amer: 48 mL/min — ABNORMAL LOW (ref >60)
GFR calc non Af Amer: 42 mL/min — ABNORMAL LOW (ref >60)
GLUCOSE: 100 mg/dL — AB (ref 65–99)
Potassium: 5.2 mmol/L — ABNORMAL HIGH (ref 3.5–5.1)
SODIUM: 141 mmol/L (ref 135–145)
TOTAL PROTEIN: 7.2 g/dL (ref 6.5–8.1)

## 2017-01-20 MED ORDER — ALENDRONATE SODIUM 70 MG PO TABS: 70.0000 mg | ORAL_TABLET | ORAL | 1 refills | Status: DC

## 2017-01-20 NOTE — Progress Notes (Signed)
[place] OFFICE PROGRESS NOTE  Patient Care Team: Lynnell Jude, MD as PCP - General (Family Medicine)  Cancer Staging No matching staging information was found for the patient.   Oncology History   1. Carcinoma of breast status post lumpectomyestrogen receptor positive, progesterone receptor positive.  Positive margin with ductal carcinoma in situ. [Dr.Smith] 2. Oncotype DX score is 28 with recurring score is high in between 14%-22% on average 18 %. 3. Cytoxan and Taxotere for 4 cycle 4. Followed by radiation therapy finished in December of 2011  5. Started on Femara.  December of 2011 6. Changed to Arimidex (insurance request) 7/finishing Arimidex  in May of 2016        Cancer of breast [facility])    Carcinoma of upper-outer quadrant of left breast in female, estrogen receptor positive ([place])   01/20/2016 Initial Diagnosis    Malignant neoplasm of upper-outer quadrant of left female breast ([place])        INTERVAL HISTORY:  Victoria Prince 83 y.o.  female pleasant patient above history of Early stage breast cancer currently on surveillance visit for follow-up.  In the interim patient had mammogram/and also bone density test. She has had no hospitalizations.   Patient denies any new lumps or bumps. Appetite is good. No weight loss headaches or bone pain. No nausea no vomiting chest pain shortness of breath or cough.  REVIEW OF SYSTEMS:  A complete 10 point review of system is done which is negative except mentioned above/history of present illness.   PAST MEDICAL HISTORY :  Past Medical History:  Diagnosis Date  . Breast cancer ([place]) 2011   left breast lumpectomy with rad and chemo tx  . Radiation 2011  . S/P chemotherapy, time since greater than 12 weeks 2011    PAST SURGICAL HISTORY :   Past Surgical History:  Procedure Laterality Date  . BREAST BIOPSY Left 2012   negative  . BREAST BIOPSY Left 2011   2 areas removed. Retroaerola area benign.   UOQ was DCIS with positive margin per Dr. Rogue Bussing notes.     FAMILY HISTORY :  No family history on file.  SOCIAL HISTORY:   Social History  Substance Use Topics  . Smoking status: Never Smoker  . Smokeless tobacco: Not on file  . Alcohol use Not on file    ALLERGIES:  is allergic to sulfa antibiotics.  MEDICATIONS:  Current Outpatient Prescriptions  Medication Sig Dispense Refill  . amitriptyline (ELAVIL) 25 MG tablet Take 25 mg by mouth at bedtime.     Marland Kitchen amLODipine (NORVASC) 2.5 MG tablet Take 2.5 mg by mouth daily.     . clobetasol cream (TEMOVATE) 1.61 % Apply 1 Application topically as needed.     . docusate sodium (COLACE) 50 MG capsule Take 50 mg by mouth daily as needed for moderate constipation.     . pravastatin (PRAVACHOL) 40 MG tablet Take 40 mg by mouth daily.     Marland Kitchen alendronate (FOSAMAX) 70 MG tablet Take 1 tablet (70 mg total) by mouth once a week. Take with a full glass of water on an empty stomach; sit upright for 2 hours. . 30 tablet 1   No current facility-administered medications for this visit.     PHYSICAL EXAMINATION: ECOG PERFORMANCE STATUS: 0 - Asymptomatic  BP 138/80 Comment: manual recheck  Pulse (!) 46   Temp 98.2 F (36.8 C) (Tympanic)   Resp 20   Ht 5' 1.5" (1.562 m)  Wt 158 lb 6.4 oz (71.8 kg)   BMI 29.44 kg/m   Filed Weights   01/20/17 1433  Weight: 158 lb 6.4 oz (71.8 kg)    GENERAL: Well-nourished well-developed; Alert, no distress and comfortable.   Alone.  EYES: no pallor or icterus OROPHARYNX: no thrush or ulceration; good dentition  NECK: supple, no masses felt LYMPH:  no palpable lymphadenopathy in the cervical, axillary or inguinal regions LUNGS: clear to auscultation and  No wheeze or crackles HEART/CVS: regular rate & rhythm and no murmurs; No lower extremity edema ABDOMEN:abdomen soft, non-tender and normal bowel sounds Musculoskeletal:no cyanosis of digits and no clubbing  PSYCH: alert & oriented x 3 with fluent  speech NEURO: no focal motor/sensory deficits SKIN:  no rashes or significant lesions Right and left BREAST exam [in the presence of nurse]- no unusual skin changes or dominant masses felt. Surgical scars noted.    LABORATORY DATA:  I have reviewed the data as listed    Component Value Date/Time   NA 141 01/20/2017 1350   NA 143 07/05/2014 0859   K 5.2 (H) 01/20/2017 1350   K 4.1 07/05/2014 0859   CL 106 01/20/2017 1350   CL 105 07/05/2014 0859   CO2 27 01/20/2017 1350   CO2 30 07/05/2014 0859   GLUCOSE 100 (H) 01/20/2017 1350   GLUCOSE 88 07/05/2014 0859   BUN 14 01/20/2017 1350   BUN 13 07/05/2014 0859   CREATININE 1.15 (H) 01/20/2017 1350   CREATININE 1.40 (H) 07/05/2014 0859   CALCIUM 10.1 01/20/2017 1350   CALCIUM 9.6 07/05/2014 0859   PROT 7.2 01/20/2017 1350   PROT 7.0 07/05/2014 0859   ALBUMIN 4.5 01/20/2017 1350   ALBUMIN 3.6 07/05/2014 0859   AST 43 (H) 01/20/2017 1350   AST 24 07/05/2014 0859   ALT 38 01/20/2017 1350   ALT 38 07/05/2014 0859   ALKPHOS 47 01/20/2017 1350   ALKPHOS 65 07/05/2014 0859   BILITOT 0.7 01/20/2017 1350   BILITOT 0.3 07/05/2014 0859   GFRNONAA 42 (L) 01/20/2017 1350   GFRNONAA 38 (L) 07/05/2014 0859   GFRNONAA 28 (L) 12/26/2013 1001   GFRAA 48 (L) 01/20/2017 1350   GFRAA 46 (L) 07/05/2014 0859   GFRAA 33 (L) 12/26/2013 1001    No results found for: SPEP, UPEP  Lab Results  Component Value Date   WBC 4.9 01/20/2017   NEUTROABS 2.2 01/20/2017   HGB 15.0 01/20/2017   HCT 44.4 01/20/2017   MCV 96.4 01/20/2017   PLT 170 01/20/2017      Chemistry      Component Value Date/Time   NA 141 01/20/2017 1350   NA 143 07/05/2014 0859   K 5.2 (H) 01/20/2017 1350   K 4.1 07/05/2014 0859   CL 106 01/20/2017 1350   CL 105 07/05/2014 0859   CO2 27 01/20/2017 1350   CO2 30 07/05/2014 0859   BUN 14 01/20/2017 1350   BUN 13 07/05/2014 0859   CREATININE 1.15 (H) 01/20/2017 1350   CREATININE 1.40 (H) 07/05/2014 0859      Component  Value Date/Time   CALCIUM 10.1 01/20/2017 1350   CALCIUM 9.6 07/05/2014 0859   ALKPHOS 47 01/20/2017 1350   ALKPHOS 65 07/05/2014 0859   AST 43 (H) 01/20/2017 1350   AST 24 07/05/2014 0859   ALT 38 01/20/2017 1350   ALT 38 07/05/2014 0859   BILITOT 0.7 01/20/2017 1350   BILITOT 0.3 07/05/2014 0859       RADIOGRAPHIC STUDIES:  I have personally reviewed the radiological images as listed and agreed with the findings in the report. No results found.   ASSESSMENT & PLAN:  Carcinoma of upper-outer quadrant of left breast in female, estrogen receptor positive ([place]) # Breast ca er/pr- pos; her 2 neu neg; high risk oncotype s/p TC [2012] s/p arimidex. Mammogram may 2018- NEG. Clinically no NED.   # Bone density OSTEOPOROSIS May 2018. The reviewed the findings with the patient in detail. Discussed the concerns for continued worsening of osteoporosis; risk for fractures. Also discussed regarding use of oral bisphosphonates-Boniva/Fosamax. Also discussed the use of injectables-Reclast/Prolia. Discussed the potential benefits also the rare side effects of osteonecrosis of the jaw; and hypocalcemia.  # After the lengthy discussion patient agrees with Fosamax. New prescription initiated. Discussed regarding reflux; and also rare risk of increased fractures.  She will call us if any issues in between. Recommend ca+ vit D  # CKD- 1.2 creatinine- monitor for now.  # follow up labs/ MD/[place]/mammogram/  in 1 year.    Orders Placed This Encounter  Procedures  . MM SCREENING BREAST TOMO BILATERAL    Standing Status:   Future    Standing Expiration Date:   03/22/2018    Order Specific Question:   Reason for Exam (SYMPTOM  OR DIAGNOSIS REQUIRED)    Answer:   Cancer of breast    Order Specific Question:   Preferred imaging location?    Answer:   [facility]   All questions were answered. The patient knows to call the clinic with any problems, questions or concerns.      Cammie Sickle, MD 01/20/2017 7:15 PM

## 2017-01-20 NOTE — Assessment & Plan Note (Addendum)
#   Breast ca er/pr- pos; her 2 neu neg; high risk oncotype s/p TC [2012] s/p arimidex. Mammogram may 2018- NEG. Clinically no NED.   # Bone density OSTEOPOROSIS May 2018. The reviewed the findings with the patient in detail. Discussed the concerns for continued worsening of osteoporosis; risk for fractures. Also discussed regarding use of oral bisphosphonates-Boniva/Fosamax. Also discussed the use of injectables-Reclast/Prolia. Discussed the potential benefits also the rare side effects of osteonecrosis of the jaw; and hypocalcemia.  # After the lengthy discussion patient agrees with Fosamax. New prescription initiated. Discussed regarding reflux; and also rare risk of increased fractures.  She will call us if any issues in between. Recommend ca+ vit D  # CKD- 1.2 creatinine- monitor for now.  # follow up labs/ MD/[place]/mammogram/  in 1 year.

## 2017-10-27 ENCOUNTER — Ambulatory Visit
Admission: RE | Admit: 2017-10-27 | Discharge: 2017-10-27 | Disposition: A | Discharge status: Home / Self Care | Payer: Medicare HMO | Source: Ambulatory Visit | Attending: Internal Medicine | Admitting: Internal Medicine

## 2017-10-27 DIAGNOSIS — Z1231 Encounter for screening mammogram for malignant neoplasm of breast: Secondary | ICD-10-CM | POA: Insufficient documentation

## 2017-10-27 DIAGNOSIS — C50412 Malignant neoplasm of upper-outer quadrant of left female breast: Secondary | ICD-10-CM

## 2017-10-27 DIAGNOSIS — Z853 Personal history of malignant neoplasm of breast: Secondary | ICD-10-CM | POA: Insufficient documentation

## 2017-10-27 DIAGNOSIS — Z170 Estrogen receptor positive status [ER+]: Secondary | ICD-10-CM

## 2017-10-27 HISTORY — DX: Personal history of irradiation: Z92.3

## 2017-10-27 HISTORY — DX: Personal history of antineoplastic chemotherapy: Z92.21

## 2018-01-17 ENCOUNTER — Other Ambulatory Visit: Payer: Self-pay | Admitting: Internal Medicine

## 2018-01-17 DIAGNOSIS — Z170 Estrogen receptor positive status [ER+]: Principal | ICD-10-CM

## 2018-01-17 DIAGNOSIS — C50412 Malignant neoplasm of upper-outer quadrant of left female breast: Secondary | ICD-10-CM

## 2018-01-18 ENCOUNTER — Inpatient Hospital Stay: Payer: Medicare HMO | Attending: Internal Medicine | Admitting: Internal Medicine

## 2018-01-18 ENCOUNTER — Inpatient Hospital Stay: Payer: Medicare HMO

## 2018-01-18 ENCOUNTER — Inpatient Hospital Stay: Payer: PPO

## 2018-01-18 ENCOUNTER — Other Ambulatory Visit: Payer: Self-pay

## 2018-01-18 VITALS — BP 158/76 | HR 76 | Temp 98.1°F | Resp 20 | Ht 61.5 in | Wt 145.0 lb

## 2018-01-18 DIAGNOSIS — Z923 Personal history of irradiation: Secondary | ICD-10-CM

## 2018-01-18 DIAGNOSIS — Z853 Personal history of malignant neoplasm of breast: Secondary | ICD-10-CM

## 2018-01-18 DIAGNOSIS — Z170 Estrogen receptor positive status [ER+]: Principal | ICD-10-CM

## 2018-01-18 DIAGNOSIS — N189 Chronic kidney disease, unspecified: Secondary | ICD-10-CM | POA: Diagnosis not present

## 2018-01-18 DIAGNOSIS — Z79899 Other long term (current) drug therapy: Secondary | ICD-10-CM | POA: Insufficient documentation

## 2018-01-18 DIAGNOSIS — M818 Other osteoporosis without current pathological fracture: Secondary | ICD-10-CM

## 2018-01-18 DIAGNOSIS — C50412 Malignant neoplasm of upper-outer quadrant of left female breast: Secondary | ICD-10-CM

## 2018-01-18 DIAGNOSIS — Z9221 Personal history of antineoplastic chemotherapy: Secondary | ICD-10-CM

## 2018-01-18 LAB — CBC WITH DIFFERENTIAL/PLATELET
Basophils Absolute: 0 K/uL (ref 0–0.1)
Basophils Relative: 0 %
Eosinophils Absolute: 0.1 K/uL (ref 0–0.7)
Eosinophils Relative: 1 %
HCT: 46.2 % (ref 35.0–47.0)
Hemoglobin: 15.3 g/dL (ref 12.0–16.0)
Lymphocytes Relative: 47 %
Lymphs Abs: 2.1 K/uL (ref 1.0–3.6)
MCH: 31.7 pg (ref 26.0–34.0)
MCHC: 33.2 g/dL (ref 32.0–36.0)
MCV: 95.6 fL (ref 80.0–100.0)
Monocytes Absolute: 0.3 K/uL (ref 0.2–0.9)
Monocytes Relative: 7 %
Neutro Abs: 2 K/uL (ref 1.4–6.5)
Neutrophils Relative %: 45 %
Platelets: 164 K/uL (ref 150–440)
RBC: 4.83 MIL/uL (ref 3.80–5.20)
RDW: 13.8 % (ref 11.5–14.5)
WBC: 4.4 K/uL (ref 3.6–11.0)

## 2018-01-18 LAB — BASIC METABOLIC PANEL
ANION GAP: 11 (ref 5–15)
BUN: 14 mg/dL (ref 8–23)
CO2: 25 mmol/L (ref 22–32)
Calcium: 10.1 mg/dL (ref 8.9–10.3)
Chloride: 107 mmol/L (ref 98–111)
Creatinine, Ser: 1.23 mg/dL — ABNORMAL HIGH (ref 0.44–1.00)
GFR calc Af Amer: 44 mL/min — ABNORMAL LOW (ref >60)
GFR calc non Af Amer: 38 mL/min — ABNORMAL LOW (ref >60)
GLUCOSE: 106 mg/dL — AB (ref 70–99)
POTASSIUM: 4.3 mmol/L (ref 3.5–5.1)
Sodium: 143 mmol/L (ref 135–145)

## 2018-01-18 NOTE — Patient Instructions (Signed)
#  Recommend calcium and vitamin D pill twice a day.

## 2018-01-18 NOTE — Progress Notes (Signed)
[place] OFFICE PROGRESS NOTE  Patient Care Team: Lynnell Jude, MD as PCP - General (Family Medicine)  Cancer Staging No matching staging information was found for the patient.   Oncology History   # 2011-  Carcinoma of breast status post lumpectomyestrogen receptor positive, progesterone receptor positive.  Positive margin with ductal carcinoma in situ. [Dr.Smith] 2. Oncotype DX score is 28 with recurring score is high in between 14%-22% on average 18 %. 3. Cytoxan and Taxotere for 4 cycle 4. Followed by radiation therapy finished in December of 2011  5. Started on Femara.  December of 2011 6. Changed to Arimidex (insurance request) 7/finishing Arimidex  in May of 2016        Cancer of breast [facility])    Carcinoma of upper-outer quadrant of left breast in female, estrogen receptor positive ([place])   01/20/2016 Initial Diagnosis    Malignant neoplasm of upper-outer quadrant of left female breast ([place])       INTERVAL HISTORY:  Victoria Prince 84 y.o.  female pleasant patient above history of stage I breast cancer status post Arimidex finished in 2016 is here for follow-up.  Patient denies any new lumps or bumps. Appetite is good.  No weight loss.  No headaches.  No bone pain.  Review of Systems  Constitutional: Negative for chills, diaphoresis, fever, malaise/fatigue and weight loss.  HENT: Negative for nosebleeds and sore throat.   Eyes: Negative for double vision.  Respiratory: Negative for cough, hemoptysis, sputum production, shortness of breath and wheezing.   Cardiovascular: Negative for chest pain, palpitations, orthopnea and leg swelling.  Gastrointestinal: Negative for abdominal pain, blood in stool, constipation, diarrhea, heartburn, melena, nausea and vomiting.  Genitourinary: Negative for dysuria, frequency and urgency.  Musculoskeletal: Negative for back pain and joint pain.  Skin: Negative.  Negative for itching and rash.  Neurological:  Negative for dizziness, tingling, focal weakness, weakness and headaches.  Endo/Heme/Allergies: Does not bruise/bleed easily.  Psychiatric/Behavioral: Negative for depression. The patient is not nervous/anxious and does not have insomnia.       PAST MEDICAL HISTORY :  Past Medical History:  Diagnosis Date  . Breast cancer ([place]) 2011   left breast lumpectomy with rad and chemo tx  . Personal history of chemotherapy 2011   left breast  . Personal history of radiation therapy 2011   left breast  . Radiation 2011  . S/P chemotherapy, time since greater than 12 weeks 2011    PAST SURGICAL HISTORY :   Past Surgical History:  Procedure Laterality Date  . BREAST BIOPSY Left 2012   negative  . BREAST BIOPSY Left 2011   2 areas removed. Retroaerola area benign.  UOQ was DCIS with positive margin per Dr. Rogue Bussing notes.     FAMILY HISTORY :  No family history on file.  SOCIAL HISTORY:   Social History   Tobacco Use  . Smoking status: Never Smoker  Substance Use Topics  . Alcohol use: Not on file  . Drug use: Not on file    ALLERGIES:  is allergic to sulfa antibiotics.  MEDICATIONS:  Current Outpatient Medications  Medication Sig Dispense Refill  . alendronate (FOSAMAX) 70 MG tablet Take 1 tablet (70 mg total) by mouth once a week. Take with a full glass of water on an empty stomach; sit upright for 2 hours. . 30 tablet 1  . amitriptyline (ELAVIL) 25 MG tablet Take 25 mg by mouth at bedtime.     Marland Kitchen amLODipine (NORVASC)  2.5 MG tablet Take 2.5 mg by mouth daily.     . clobetasol cream (TEMOVATE) 6.27 % Apply 1 Application topically as needed.     . pravastatin (PRAVACHOL) 40 MG tablet Take 40 mg by mouth daily.      No current facility-administered medications for this visit.     PHYSICAL EXAMINATION: ECOG PERFORMANCE STATUS: 0 - Asymptomatic  BP (!) 158/76 (Patient Position: Sitting)   Pulse 76   Temp 98.1 F (36.7 C) (Tympanic)   Resp 20   Ht 5' 1.5" (1.562 m)   Wt  145 lb (65.8 kg)   BMI 26.95 kg/m   Filed Weights   01/18/18 1048  Weight: 145 lb (65.8 kg)    GENERAL: Well-nourished well-developed; Alert, no distress and comfortable.  Alone.  EYES: no pallor or icterus OROPHARYNX: no thrush or ulceration; NECK: supple; no lymph nodes felt. LYMPH:  no palpable lymphadenopathy in the axillary or inguinal regions LUNGS: Decreased breath sounds auscultation bilaterally. No wheeze or crackles HEART/CVS: regular rate & rhythm and no murmurs; No lower extremity edema ABDOMEN:abdomen soft, non-tender and normal bowel sounds. No hepatomegaly or splenomegaly.  Musculoskeletal:no cyanosis of digits and no clubbing  PSYCH: alert & oriented x 3 with fluent speech NEURO: no focal motor/sensory deficits SKIN:  no rashes or significant lesions  Right and left BREAST exam [in the presence of nurse]- no unusual skin changes or dominant masses felt. Surgical scars noted.      LABORATORY DATA:  I have reviewed the data as listed    Component Value Date/Time   NA 143 01/18/2018 1041   NA 143 07/05/2014 0859   K 4.3 01/18/2018 1041   K 4.1 07/05/2014 0859   CL 107 01/18/2018 1041   CL 105 07/05/2014 0859   CO2 25 01/18/2018 1041   CO2 30 07/05/2014 0859   GLUCOSE 106 (H) 01/18/2018 1041   GLUCOSE 88 07/05/2014 0859   BUN 14 01/18/2018 1041   BUN 13 07/05/2014 0859   CREATININE 1.23 (H) 01/18/2018 1041   CREATININE 1.40 (H) 07/05/2014 0859   CALCIUM 10.1 01/18/2018 1041   CALCIUM 9.6 07/05/2014 0859   PROT 7.2 01/20/2017 1350   PROT 7.0 07/05/2014 0859   ALBUMIN 4.5 01/20/2017 1350   ALBUMIN 3.6 07/05/2014 0859   AST 43 (H) 01/20/2017 1350   AST 24 07/05/2014 0859   ALT 38 01/20/2017 1350   ALT 38 07/05/2014 0859   ALKPHOS 47 01/20/2017 1350   ALKPHOS 65 07/05/2014 0859   BILITOT 0.7 01/20/2017 1350   BILITOT 0.3 07/05/2014 0859   GFRNONAA 38 (L) 01/18/2018 1041   GFRNONAA 38 (L) 07/05/2014 0859   GFRNONAA 28 (L) 12/26/2013 1001   GFRAA 44  (L) 01/18/2018 1041   GFRAA 46 (L) 07/05/2014 0859   GFRAA 33 (L) 12/26/2013 1001    No results found for: SPEP, UPEP  Lab Results  Component Value Date   WBC 4.4 01/18/2018   NEUTROABS 2.0 01/18/2018   HGB 15.3 01/18/2018   HCT 46.2 01/18/2018   MCV 95.6 01/18/2018   PLT 164 01/18/2018      Chemistry      Component Value Date/Time   NA 143 01/18/2018 1041   NA 143 07/05/2014 0859   K 4.3 01/18/2018 1041   K 4.1 07/05/2014 0859   CL 107 01/18/2018 1041   CL 105 07/05/2014 0859   CO2 25 01/18/2018 1041   CO2 30 07/05/2014 0859   BUN 14 01/18/2018 1041  BUN 13 07/05/2014 0859   CREATININE 1.23 (H) 01/18/2018 1041   CREATININE 1.40 (H) 07/05/2014 0859      Component Value Date/Time   CALCIUM 10.1 01/18/2018 1041   CALCIUM 9.6 07/05/2014 0859   ALKPHOS 47 01/20/2017 1350   ALKPHOS 65 07/05/2014 0859   AST 43 (H) 01/20/2017 1350   AST 24 07/05/2014 0859   ALT 38 01/20/2017 1350   ALT 38 07/05/2014 0859   BILITOT 0.7 01/20/2017 1350   BILITOT 0.3 07/05/2014 0859       RADIOGRAPHIC STUDIES: I have personally reviewed the radiological images as listed and agreed with the findings in the report. No results found.   ASSESSMENT & PLAN:  Carcinoma of upper-outer quadrant of left breast in female, estrogen receptor positive ([place]) # Breast ca er/pr- pos; her 2 neu neg; high risk oncotype s/p TC [2012] s/p arimidex. Mammogram may 2018- NEG. Clinically no NED.  Stable.  # Bone density OSTEOPOROSIS May 2018.  Patient currently on calcium vitamin D.  Stable.  Also on Fosamax.  No side effects noted  # CKD- 1.2 creatinine- monitor for now.  Stable.  # follow up labs/ MD/[place]/mammogram/  in 1 year.    Orders Placed This Encounter  Procedures  . MM 3D SCREEN BREAST BILATERAL    Standing Status:   Future    Standing Expiration Date:   03/22/2019    Order Specific Question:   Reason for Exam (SYMPTOM  OR DIAGNOSIS REQUIRED)    Answer:   history of breast cancer     Order Specific Question:   Preferred imaging location?    Answer:   [facility]  . CBC with Differential    Standing Status:   Future    Standing Expiration Date:   07/22/2019  . Comprehensive metabolic panel    Standing Status:   Future    Standing Expiration Date:   07/22/2019   All questions were answered. The patient knows to call the clinic with any problems, questions or concerns.      Cammie Sickle, MD 01/30/2018 5:53 PM

## 2018-01-18 NOTE — Progress Notes (Signed)
RN Chaperoned provider with Breast Exam.   

## 2018-01-18 NOTE — Assessment & Plan Note (Addendum)
#   Breast ca er/pr- pos; her 2 neu neg; high risk oncotype s/p TC [2012] s/p arimidex [finished may 2016]. Mammogram may 2019- NEG. Clinically no NED.  Stable.  # Bone density OSTEOPOROSIS May 2018.  Patient currently on calcium vitamin D.  Stable.  Also on Fosamax.  No side effects noted  # CKD- 1.2 creatinine- monitor for now.  Stable.  # follow up labs/ MD/[place]/mammogram/  in 1 year.

## 2018-01-20 ENCOUNTER — Other Ambulatory Visit: Payer: PPO

## 2018-01-20 ENCOUNTER — Ambulatory Visit: Payer: PPO | Admitting: Internal Medicine

## 2018-11-01 ENCOUNTER — Ambulatory Visit: Payer: Medicare HMO

## 2018-12-07 ENCOUNTER — Ambulatory Visit
Admission: RE | Admit: 2018-12-07 | Discharge: 2018-12-07 | Disposition: A | Discharge status: Home / Self Care | Payer: Medicare HMO | Source: Ambulatory Visit | Attending: Internal Medicine | Admitting: Internal Medicine

## 2018-12-07 ENCOUNTER — Other Ambulatory Visit: Payer: Self-pay

## 2018-12-07 ENCOUNTER — Encounter (INDEPENDENT_AMBULATORY_CARE_PROVIDER_SITE_OTHER): Payer: Self-pay

## 2018-12-07 DIAGNOSIS — Z1231 Encounter for screening mammogram for malignant neoplasm of breast: Secondary | ICD-10-CM | POA: Diagnosis not present

## 2018-12-07 DIAGNOSIS — C50412 Malignant neoplasm of upper-outer quadrant of left female breast: Secondary | ICD-10-CM | POA: Insufficient documentation

## 2018-12-07 DIAGNOSIS — Z170 Estrogen receptor positive status [ER+]: Secondary | ICD-10-CM | POA: Insufficient documentation

## 2019-01-15 ENCOUNTER — Other Ambulatory Visit: Payer: Self-pay | Admitting: Hematology and Oncology

## 2019-01-15 DIAGNOSIS — Z170 Estrogen receptor positive status [ER+]: Secondary | ICD-10-CM

## 2019-01-15 DIAGNOSIS — M810 Age-related osteoporosis without current pathological fracture: Secondary | ICD-10-CM

## 2019-01-15 DIAGNOSIS — C50412 Malignant neoplasm of upper-outer quadrant of left female breast: Secondary | ICD-10-CM

## 2019-01-15 NOTE — Progress Notes (Signed)
[facility]  [address] [place], [place] [address] Phone: [phone]  Fax: [phone]   Clinic Day:  01/16/2019  Referring physician: Lynnell Jude, MD  Chief Complaint: Victoria Prince is a 85 y.o. female with a history of stage I left breast cancer who is seen for new patient assessment.  HPI: The patient has a history of stage I left breast cancer in 2011. The cancer was detected on a routine 35-month follow-up mammogram on 09/23/2009 for indeterminate nodularity in the left breast. Imaging showed mildly dense parenchymal pattern with stable, benign appearing calcifications and persistent nodular density with calcification in the upper outer left breast mid to posterior region. Left breast biopsy on 10/03/2009 showed ER/PR positive DCIS.   She underwent a left breast lumpectomy and sentinel lymph node biopsy on 11/08/2009 with Dr. Rochel Brome. Pathology revealed carcinoma of the breast (no pathology available).  Oncotype DX testing revealed a score of 28, with an 18% (CI 14-22%) chance of recurrence without systemic treatment.   She underwent adjuvant chemotherapy with 4 cycles of Taxotere and Cytoxan (TC). She underwent adjuvant radiation therapy, completed 05/2010. She was started on endocrine therapy with Femara in 05/2010 and later switched to Arimidex due to insurance coverage. She completed endocrine therapy in 10/2014.   CA27.29 has been followed: 12.1 on 04/11/2010, 23.2 on 06/16/2010, 11.8 on 12/12/2010, 24.6 on 112/27/2012, 18.4 on 12/17/2011, 24.0 on 06/09/2012, and 20.2 on 06/27/2013.   Bone density on 10/17/2013 revealed osteopenia with a T-score of -2.2 at the femur neck right. Bone density on 10/23/2014 revealed osteopenia with a T-score of -1.9 at the femur neck. Bone density on 10/26/2016 revealed osteoporosis with a T-score of -3.5 at the forearm radius. She is currently on calcium, Vitamin D, and Fosamax.   She was initially  followed by Dr. Oliva Bustard. She transitioned to the care of Dr. Rogue Bussing on 01/20/2016. Notes reviewed.   The patient was last seen in the medical oncology clinic on 01/18/2018 by Dr. Rogue Bussing. At that time, she was doing well. She denied any new lumps or bumps. Her appetite was good. She denied any headaches, weight loss, or bone pain. She continued on surveillance.   Bilateral screening mammogram on 12/07/2018 revealed no evidence of malignancy.   During the interim, she has been doing "good." She notes arthritic joint pain in her knees. She denies any fevers, sweats, or weight loss. She denies any headaches or visual changes. She denies any cough, shortness of breath, or chest pain. She notes occasional constipation, followed by diarrhea. She denies any skin changes. She denies any numbness, weakness, falls, or coordination issues.   She notes teary, red eyes, buy denies itching. She attributes this to allergies.   She has a family history of stomach cancer in her mother and unknown cancer in her sister. She denies any family history of breast cancer. She performs monthly breast exams. She discontinued Fosamax (unknown date).    Past Medical History:  Diagnosis Date  . Breast cancer ([place]) 2011   left breast lumpectomy with rad and chemo tx  . Personal history of chemotherapy 2011   left breast  . Personal history of radiation therapy 2011   left breast  . Radiation 2011  . S/P chemotherapy, time since greater than 12 weeks 2011    Past Surgical History:  Procedure Laterality Date  . BREAST BIOPSY Left 2012   negative  . BREAST BIOPSY Left 2011   2 areas removed. Retroaerola  area benign.  UOQ was DCIS with positive margin per Dr. Rogue Bussing notes.     Family History  Problem Relation Age of Onset  . Breast cancer Neg Hx     Social History:  reports that she has never smoked. She has never used smokeless tobacco. No history on file for alcohol and drug. She does not smoke or  drink alcohol. She denies any known exposure to radiation or toxins. She did not work outside the home. Her husband passed away in 09/27/2017. She lives in [place] with her son. The patient is alone today.  Allergies:  Allergies  Allergen Reactions  . Sulfa Antibiotics Rash    Current Medications: Current Outpatient Medications  Medication Sig Dispense Refill  . alendronate (FOSAMAX) 70 MG tablet Take 1 tablet (70 mg total) by mouth once a week. Take with a full glass of water on an empty stomach; sit upright for 2 hours. . 30 tablet 1  . amitriptyline (ELAVIL) 25 MG tablet Take 25 mg by mouth at bedtime.     Marland Kitchen amLODipine (NORVASC) 2.5 MG tablet Take 2.5 mg by mouth daily.     Marland Kitchen levocetirizine (XYZAL) 5 MG tablet Take 10 mg by mouth every evening.    . pravastatin (PRAVACHOL) 40 MG tablet Take 40 mg by mouth daily.     . clobetasol cream (TEMOVATE) 0.08 % Apply 1 Application topically as needed.      No current facility-administered medications for this visit.     Review of Systems  Constitutional: Negative.  Negative for chills, diaphoresis, fever, malaise/fatigue and weight loss.  HENT: Negative.  Negative for congestion, hearing loss, sinus pain and sore throat.   Eyes: Positive for redness. Negative for blurred vision.  Respiratory: Negative.  Negative for cough, sputum production and shortness of breath.   Cardiovascular: Negative.  Negative for chest pain, palpitations, orthopnea, leg swelling and PND.  Gastrointestinal: Positive for constipation (occasionally) and diarrhea (occasionally, following constipation). Negative for abdominal pain, blood in stool, melena, nausea and vomiting.  Genitourinary: Negative.  Negative for dysuria, frequency, hematuria and urgency.  Musculoskeletal: Positive for joint pain (arthritis, knees). Negative for back pain and myalgias.  Skin: Negative.  Negative for rash.  Neurological: Negative.  Negative for dizziness, tingling, sensory change, weakness  and headaches.  Endo/Heme/Allergies: Positive for environmental allergies. Does not bruise/bleed easily.  Psychiatric/Behavioral: Positive for memory loss. Negative for depression and substance abuse. The patient is not nervous/anxious and does not have insomnia.   All other systems reviewed and are negative.  Performance status (ECOG): 1  Vitals Blood pressure 139/64, pulse 76, temperature 98.7 F (37.1 C), temperature source Tympanic, resp. rate 18, weight 150 lb 9.2 oz (68.3 kg), SpO2 99 %.   Physical Exam  Constitutional: She is oriented to person, place, and time. She appears well-developed and well-nourished. No distress.  HENT:  Head: Normocephalic and atraumatic.  Mouth/Throat: Oropharynx is clear and moist. No oropharyngeal exudate.  Eyes: Pupils are equal, round, and reactive to light. EOM are normal. Right eye exhibits chemosis. Left eye exhibits chemosis.  Neck: Normal range of motion. Neck supple.  Cardiovascular: Normal rate, regular rhythm and normal heart sounds.  No murmur heard. Pulmonary/Chest: Effort normal and breath sounds normal. No respiratory distress. She has no wheezes. Right breast exhibits skin change (fibrocystic changes UOQ; mark at 5 o'clock, 5cm from nipple) and tenderness. Right breast exhibits no inverted nipple, no mass and no nipple discharge. Left breast exhibits inverted nipple (s/p lumpectomy), skin change (  s/p left lumpectomy; scarring and post-radiation changes inferiorly; scarring with capillary webbing in axilla) and tenderness. Left breast exhibits no mass and no nipple discharge.  Abdominal: Soft. Bowel sounds are normal. She exhibits no distension. There is no abdominal tenderness.  Musculoskeletal: Normal range of motion.        General: No edema.  Lymphadenopathy:    She has no cervical adenopathy.    She has no axillary adenopathy.       Right: No supraclavicular adenopathy present.       Left: No supraclavicular adenopathy present.   Neurological: She is alert and oriented to person, place, and time.  Skin: Skin is warm and dry. She is not diaphoretic. No erythema.  Psychiatric: She has a normal mood and affect. Her behavior is normal. Judgment and thought content normal.  Nursing note and vitals reviewed.   Appointment on 01/16/2019  Component Date Value Ref Range Status  . Sodium 01/16/2019 137  135 - 145 mmol/L Final  . Potassium 01/16/2019 4.4  3.5 - 5.1 mmol/L Final  . Chloride 01/16/2019 105  98 - 111 mmol/L Final  . CO2 01/16/2019 24  22 - 32 mmol/L Final  . Glucose, Bld 01/16/2019 105* 70 - 99 mg/dL Final  . BUN 01/16/2019 13  8 - 23 mg/dL Final  . Creatinine, Ser 01/16/2019 1.32* 0.44 - 1.00 mg/dL Final  . Calcium 01/16/2019 9.3  8.9 - 10.3 mg/dL Final  . Total Protein 01/16/2019 7.0  6.5 - 8.1 g/dL Final  . Albumin 01/16/2019 3.9  3.5 - 5.0 g/dL Final  . AST 01/16/2019 34  15 - 41 U/L Final  . ALT 01/16/2019 28  0 - 44 U/L Final  . Alkaline Phosphatase 01/16/2019 54  38 - 126 U/L Final  . Total Bilirubin 01/16/2019 0.4  0.3 - 1.2 mg/dL Final  . GFR calc non Af Amer 01/16/2019 36* >60 mL/min Final  . GFR calc Af Amer 01/16/2019 42* >60 mL/min Final  . Anion gap 01/16/2019 8  5 - 15 Final   Performed at [facility], [address], [place], [place] [address]  . WBC 01/16/2019 4.7  4.0 - 10.5 K/uL Final  . RBC 01/16/2019 4.64  3.87 - 5.11 MIL/uL Final  . Hemoglobin 01/16/2019 14.9  12.0 - 15.0 g/dL Final  . HCT 01/16/2019 45.1  36.0 - 46.0 % Final  . MCV 01/16/2019 97.2  80.0 - 100.0 fL Final  . MCH 01/16/2019 32.1  26.0 - 34.0 pg Final  . MCHC 01/16/2019 33.0  30.0 - 36.0 g/dL Final  . RDW 01/16/2019 13.8  11.5 - 15.5 % Final  . Platelets 01/16/2019 160  150 - 400 K/uL Final  . nRBC 01/16/2019 0.0  0.0 - 0.2 % Final  . Neutrophils Relative % 01/16/2019 43  % Final  . Neutro Abs 01/16/2019 2.0  1.7 - 7.7 K/uL Final  . Lymphocytes Relative 01/16/2019 46  % Final  . Lymphs Abs  01/16/2019 2.2  0.7 - 4.0 K/uL Final  . Monocytes Relative 01/16/2019 8  % Final  . Monocytes Absolute 01/16/2019 0.4  0.1 - 1.0 K/uL Final  . Eosinophils Relative 01/16/2019 2  % Final  . Eosinophils Absolute 01/16/2019 0.1  0.0 - 0.5 K/uL Final  . Basophils Relative 01/16/2019 1  % Final  . Basophils Absolute 01/16/2019 0.0  0.0 - 0.1 K/uL Final  . Immature Granulocytes 01/16/2019 0  % Final  . Abs Immature Granulocytes 01/16/2019 0.01  0.00 -  0.07 K/uL Final   Performed at [facility], [address], [place], [place] [address]    Assessment:  Victoria Prince is a 85 y.o. female a history of stage I left breast cancer s/p lumpectomy and sentinel lymph node biopsy on 11/08/2009.  Pathology revealed carcinoma of the breast (no pathology available). DCIS was ER/PR +.  Oncotype DX revealed a score of 28, with an 18% (CI 14-22%) chance of recurrence without systemic treatment.   She received 4 cycles of Taxotere and Cytoxan (TC). She underwent adjuvant radiation, completed 05/2010. She began Femara in 05/2010 and later switched to Arimidex due to insurance coverage. She completed endocrine therapy in 10/2014.   Bilateral screening mammogram on 12/07/2018 revealed no evidence of malignancy.   CA27.29 has been followed: 12.1 on 04/11/2010, 23.2 on 06/16/2010, 11.8 on 12/12/2010, 24.6 on 112/27/2012, 18.4 on 12/17/2011, 24.0 on 06/09/2012, and 20.2 on 06/27/2013.   Bone density on 10/17/2013 revealed osteopenia with a T-score of -2.2 at the femur neck right. Bone density on 10/23/2014 revealed osteopenia with a T-score of -1.9 at the femur neck. Bone density on 10/26/2016 revealed osteopenia with a T-score of -3.5 at the forearm radius. She is currently on calcium and Vitamin D.  She discontinued Fosamax.   Symptomatically, she is doing well.  She denies any breast concerns.  Exam reveals post-operative and post radiation changes.  Plan: 1.   Labs today: CBC with diff, CMP,  CA27.29. 2.   Stage I left breast cancer  Review entire medical history, diagnosis and management of stage I breast cancer.   She is s/p lumpectomy, 4 cycles of adjuvant TC, radiation , and 5 years of endocrine therapy (completed 10/2014).  Mammogram on 12/07/2018 revealed no evidence of disease.  Clinically, she is doing well without evidence of recurrent disease. 3.   Osteoporosis  Discuss progression from osteopenia to osteoporosis from 2015 to 2018.  Continue bone density every 2 years (overdue).  Continue calcium and vitamin D.  Consider Prolia.  Schedule bone density on 01/19/2019. 4.   Bilateral mammogram 12/08/2019. 5.   RTC in 1 year for MD assessment, labs (CBC with diff, CMP, CA27.29), and review of mammogram.    I discussed the assessment and treatment plan with the patient.  The patient was provided an opportunity to ask questions and all were answered.  The patient agreed with the plan and demonstrated an understanding of the instructions.  The patient was advised to call back if the symptoms worsen or if the condition fails to improve as anticipated.  I provided 25 minutes of face-to-face time during this this encounter and > 50% was spent counseling as documented under my assessment and plan.    Lequita Asal, MD, PhD    01/16/2019, 3:31 PM  I, Molly Dorshimer, am acting as Education administrator for [employer]. Mike Gip, MD, PhD.  I, Melissa C. Mike Gip, MD, have reviewed the above documentation for accuracy and completeness, and I agree with the above.

## 2019-01-16 ENCOUNTER — Inpatient Hospital Stay: Payer: Medicare HMO | Attending: Hematology and Oncology

## 2019-01-16 ENCOUNTER — Encounter: Payer: Self-pay | Admitting: Hematology and Oncology

## 2019-01-16 ENCOUNTER — Other Ambulatory Visit: Payer: Self-pay

## 2019-01-16 ENCOUNTER — Inpatient Hospital Stay (HOSPITAL_BASED_OUTPATIENT_CLINIC_OR_DEPARTMENT_OTHER): Payer: Medicare HMO | Admitting: Hematology and Oncology

## 2019-01-16 VITALS — BP 139/64 | HR 76 | Temp 98.7°F | Resp 18 | Wt 150.6 lb

## 2019-01-16 DIAGNOSIS — R197 Diarrhea, unspecified: Secondary | ICD-10-CM | POA: Insufficient documentation

## 2019-01-16 DIAGNOSIS — Z800 Family history of malignant neoplasm of digestive organs: Secondary | ICD-10-CM | POA: Insufficient documentation

## 2019-01-16 DIAGNOSIS — Z79899 Other long term (current) drug therapy: Secondary | ICD-10-CM | POA: Insufficient documentation

## 2019-01-16 DIAGNOSIS — K5900 Constipation, unspecified: Secondary | ICD-10-CM | POA: Diagnosis not present

## 2019-01-16 DIAGNOSIS — M810 Age-related osteoporosis without current pathological fracture: Secondary | ICD-10-CM | POA: Diagnosis not present

## 2019-01-16 DIAGNOSIS — M25561 Pain in right knee: Secondary | ICD-10-CM | POA: Diagnosis not present

## 2019-01-16 DIAGNOSIS — Z853 Personal history of malignant neoplasm of breast: Secondary | ICD-10-CM | POA: Diagnosis not present

## 2019-01-16 DIAGNOSIS — Z170 Estrogen receptor positive status [ER+]: Secondary | ICD-10-CM | POA: Insufficient documentation

## 2019-01-16 DIAGNOSIS — Z79811 Long term (current) use of aromatase inhibitors: Secondary | ICD-10-CM | POA: Insufficient documentation

## 2019-01-16 DIAGNOSIS — Z923 Personal history of irradiation: Secondary | ICD-10-CM | POA: Insufficient documentation

## 2019-01-16 DIAGNOSIS — C50412 Malignant neoplasm of upper-outer quadrant of left female breast: Secondary | ICD-10-CM | POA: Insufficient documentation

## 2019-01-16 DIAGNOSIS — M25562 Pain in left knee: Secondary | ICD-10-CM | POA: Diagnosis not present

## 2019-01-16 DIAGNOSIS — Z882 Allergy status to sulfonamides status: Secondary | ICD-10-CM | POA: Diagnosis not present

## 2019-01-16 LAB — CBC WITH DIFFERENTIAL/PLATELET
Abs Immature Granulocytes: 0.01 10*3/uL (ref 0.00–0.07)
Basophils Absolute: 0 10*3/uL (ref 0.0–0.1)
Basophils Relative: 1 %
Eosinophils Absolute: 0.1 10*3/uL (ref 0.0–0.5)
Eosinophils Relative: 2 %
HCT: 45.1 % (ref 36.0–46.0)
Hemoglobin: 14.9 g/dL (ref 12.0–15.0)
Immature Granulocytes: 0 %
Lymphocytes Relative: 46 %
Lymphs Abs: 2.2 10*3/uL (ref 0.7–4.0)
MCH: 32.1 pg (ref 26.0–34.0)
MCHC: 33 g/dL (ref 30.0–36.0)
MCV: 97.2 fL (ref 80.0–100.0)
Monocytes Absolute: 0.4 10*3/uL (ref 0.1–1.0)
Monocytes Relative: 8 %
Neutro Abs: 2 10*3/uL (ref 1.7–7.7)
Neutrophils Relative %: 43 %
Platelets: 160 10*3/uL (ref 150–400)
RBC: 4.64 MIL/uL (ref 3.87–5.11)
RDW: 13.8 % (ref 11.5–15.5)
WBC: 4.7 10*3/uL (ref 4.0–10.5)
nRBC: 0 % (ref 0.0–0.2)

## 2019-01-16 LAB — COMPREHENSIVE METABOLIC PANEL
ALT: 28 U/L (ref 0–44)
AST: 34 U/L (ref 15–41)
Albumin: 3.9 g/dL (ref 3.5–5.0)
Alkaline Phosphatase: 54 U/L (ref 38–126)
Anion gap: 8 (ref 5–15)
BUN: 13 mg/dL (ref 8–23)
CO2: 24 mmol/L (ref 22–32)
Calcium: 9.3 mg/dL (ref 8.9–10.3)
Chloride: 105 mmol/L (ref 98–111)
Creatinine, Ser: 1.32 mg/dL — ABNORMAL HIGH (ref 0.44–1.00)
GFR calc Af Amer: 42 mL/min — ABNORMAL LOW (ref >60)
GFR calc non Af Amer: 36 mL/min — ABNORMAL LOW (ref >60)
Glucose, Bld: 105 mg/dL — ABNORMAL HIGH (ref 70–99)
Potassium: 4.4 mmol/L (ref 3.5–5.1)
Sodium: 137 mmol/L (ref 135–145)
Total Bilirubin: 0.4 mg/dL (ref 0.3–1.2)
Total Protein: 7 g/dL (ref 6.5–8.1)

## 2019-01-16 NOTE — Progress Notes (Signed)
No new changes noted today 

## 2019-01-17 ENCOUNTER — Other Ambulatory Visit: Payer: Medicare HMO

## 2019-01-17 ENCOUNTER — Ambulatory Visit: Payer: Medicare HMO | Admitting: Internal Medicine

## 2019-01-17 LAB — CANCER ANTIGEN 27.29: CA 27.29: 11.2 U/mL (ref 0.0–38.6)

## 2019-01-18 ENCOUNTER — Ambulatory Visit
Admission: RE | Admit: 2019-01-18 | Discharge: 2019-01-18 | Disposition: A | Discharge status: Home / Self Care | Payer: Medicare HMO | Source: Ambulatory Visit | Attending: Hematology and Oncology | Admitting: Hematology and Oncology

## 2019-01-18 ENCOUNTER — Other Ambulatory Visit: Payer: Self-pay

## 2019-01-18 DIAGNOSIS — M810 Age-related osteoporosis without current pathological fracture: Secondary | ICD-10-CM

## 2019-01-20 ENCOUNTER — Telehealth: Payer: Self-pay

## 2019-01-20 NOTE — Telephone Encounter (Signed)
Informed patient of bone density results. Patient advised that results would be forwarded to her PCP to review and that she could also discuss this with them. Patient made aware of Dr. Kem Parkinson recommendation of Prolia injections and that she would need dental clearance prior to starting this if this is what she decides. Patient states she is going to reach out to PCP to inquire on options as well and will be in touch with our office.

## 2019-01-20 NOTE — Telephone Encounter (Signed)
-----   Message from Lequita Asal, MD sent at 01/19/2019  4:43 PM EDT ----- Regarding: Please call patient  Bone density is thinner.  We can forward to her PCP or consider treatment with Prolia.  M ----- Message ----- From: Interface, Rad Results In Sent: 01/19/2019  11:56 AM EDT To: Lequita Asal, MD

## 2019-01-24 ENCOUNTER — Telehealth: Payer: Self-pay

## 2019-01-24 NOTE — Telephone Encounter (Signed)
Spoke with the patient to inform her of her bone density result. The patient was understanding and wanted her result sent to her PCP. Results were routed to the patient PCP today. The patient was understanding and agreeable.

## 2019-01-24 NOTE — Telephone Encounter (Signed)
-----   Message from Lequita Asal, MD sent at 01/23/2019  6:13 AM EDT ----- Regarding: Please call patient  Bone density reveals increasing osteoporosis.  Consider follow-up with PCP (she had stopped Fosamax) or possible Prolia or Zometa.  M

## 2019-07-05 ENCOUNTER — Ambulatory Visit: Payer: Medicare HMO | Attending: Internal Medicine

## 2019-07-05 DIAGNOSIS — Z20822 Contact with and (suspected) exposure to covid-19: Secondary | ICD-10-CM

## 2019-07-06 LAB — NOVEL CORONAVIRUS, NAA: SARS-CoV-2, NAA: NOT DETECTED

## 2019-10-16 ENCOUNTER — Inpatient Hospital Stay: Admission: RE | Admit: 2019-10-16 | Payer: Medicare HMO | Source: Ambulatory Visit

## 2019-11-26 NOTE — Progress Notes (Unsigned)
[facility]  [address], [address] [place], [place] [address] Phone: [phone]  Fax: [phone]   Clinic Day:  11/26/2019  Referring physician: Sofie Hartigan, MD  Chief Complaint: Victoria Prince is a 86 y.o. female with a history of stage I left breast cancer who is seen at the patient's request for a new health issue.  HPI: The patient was last seen in the medical oncology clinic on 01/16/2019 for new patient assessment.  She was s/p lumpectomy, 4 cycles of adjuvant TC, radiation , and 5 years of endocrine therapy (completed 10/2014).  Mammogram on 12/07/2018 revealed no evidence of disease.  She had no evidence of recurrent disease.   At last visit, she was noted to have progression from osteopenia to osteoporosis from September 24, 2013 to September 24, 2016.  She was overdue for a bone density study.  She was to continue calcium and vitamin D.  We discussed consideration of Prolia.  Bone density on 01/18/2019 revealed osteoporosis with a T-score of -3.9 in the forearm radius.  During the interim,     Past Medical History:  Diagnosis Date  . Breast cancer ([place]) 09/24/2009   left breast lumpectomy with rad and chemo tx  . Personal history of chemotherapy 2009/09/24   left breast  . Personal history of radiation therapy 09/24/2009   left breast  . Radiation 09-24-09  . S/P chemotherapy, time since greater than 12 weeks 09-24-2009    Past Surgical History:  Procedure Laterality Date  . BREAST BIOPSY Left 2010-09-25   negative  . BREAST BIOPSY Left 09/24/09   2 areas removed. Retroaerola area benign.  UOQ was DCIS with positive margin per Dr. Rogue Bussing notes.     Family History  Problem Relation Age of Onset  . Breast cancer Neg Hx     Social History:  reports that she has never smoked. She has never used smokeless tobacco. No history on file for alcohol and drug. She does not smoke or drink alcohol. She denies any known exposure to radiation or toxins. She did not work outside the home. Her  husband passed away in 09-24-17. She lives in [place] with her son. The patient is alone today.  Allergies:  Allergies  Allergen Reactions  . Sulfa Antibiotics Rash    Current Medications: Current Outpatient Medications  Medication Sig Dispense Refill  . alendronate (FOSAMAX) 70 MG tablet Take 1 tablet (70 mg total) by mouth once a week. Take with a full glass of water on an empty stomach; sit upright for 2 hours. . 30 tablet 1  . amitriptyline (ELAVIL) 25 MG tablet Take 25 mg by mouth at bedtime.     Marland Kitchen amLODipine (NORVASC) 2.5 MG tablet Take 2.5 mg by mouth daily.     . clobetasol cream (TEMOVATE) 7.02 % Apply 1 Application topically as needed.     Marland Kitchen levocetirizine (XYZAL) 5 MG tablet Take 10 mg by mouth every evening.    . pravastatin (PRAVACHOL) 40 MG tablet Take 40 mg by mouth daily.      No current facility-administered medications for this visit.    Review of Systems  Constitutional: Negative.  Negative for chills, diaphoresis, fever, malaise/fatigue and weight loss.  HENT: Negative.  Negative for congestion, hearing loss, sinus pain and sore throat.   Eyes: Positive for redness. Negative for blurred vision.  Respiratory: Negative.  Negative for cough, sputum production and shortness of breath.   Cardiovascular: Negative.  Negative for chest pain, palpitations, orthopnea, leg swelling and  PND.  Gastrointestinal: Positive for constipation (occasionally) and diarrhea (occasionally, following constipation). Negative for abdominal pain, blood in stool, melena, nausea and vomiting.  Genitourinary: Negative.  Negative for dysuria, frequency, hematuria and urgency.  Musculoskeletal: Positive for joint pain (arthritis, knees). Negative for back pain and myalgias.  Skin: Negative.  Negative for rash.  Neurological: Negative.  Negative for dizziness, tingling, sensory change, weakness and headaches.  Endo/Heme/Allergies: Positive for environmental allergies. Does not bruise/bleed easily.    Psychiatric/Behavioral: Positive for memory loss. Negative for depression and substance abuse. The patient is not nervous/anxious and does not have insomnia.   All other systems reviewed and are negative.  Performance status (ECOG): 1  Vitals There were no vitals taken for this visit.   Physical Exam  Constitutional: She is oriented to person, place, and time. She appears well-developed and well-nourished. No distress.  HENT:  Head: Normocephalic and atraumatic.  Mouth/Throat: Oropharynx is clear and moist. No oropharyngeal exudate.  Eyes: Pupils are equal, round, and reactive to light. EOM are normal. Right eye exhibits chemosis. Left eye exhibits chemosis.  Cardiovascular: Normal rate, regular rhythm and normal heart sounds.  No murmur heard. Pulmonary/Chest: Effort normal and breath sounds normal. No respiratory distress. She has no wheezes. Right breast exhibits skin change (fibrocystic changes UOQ; mark at 5 o'clock, 5cm from nipple) and tenderness. Right breast exhibits no inverted nipple, no mass and no nipple discharge. Left breast exhibits inverted nipple (s/p lumpectomy), skin change (s/p left lumpectomy; scarring and post-radiation changes inferiorly; scarring with capillary webbing in axilla) and tenderness. Left breast exhibits no mass and no nipple discharge.  Abdominal: Soft. Bowel sounds are normal. She exhibits no distension. There is no abdominal tenderness.  Musculoskeletal:        General: No edema. Normal range of motion.     Cervical back: Normal range of motion and neck supple.  Lymphadenopathy:    She has no cervical adenopathy.    She has no axillary adenopathy.       Right: No supraclavicular adenopathy present.       Left: No supraclavicular adenopathy present.  Neurological: She is alert and oriented to person, place, and time.  Skin: Skin is warm and dry. She is not diaphoretic. No erythema.  Psychiatric: She has a normal mood and affect. Her behavior is  normal. Judgment and thought content normal.  Nursing note and vitals reviewed.   No visits with results within 3 Day(s) from this visit.  Latest known visit with results is:  Lab on 07/05/2019  Component Date Value Ref Range Status  . SARS-CoV-2, NAA 07/05/2019 Not Detected  Not Detected Final   Comment: This nucleic acid amplification test was developed and its performance characteristics determined by [employer]. Nucleic acid amplification tests include PCR and TMA. This test has not been FDA cleared or approved. This test has been authorized by FDA under an Emergency Use Authorization (EUA). This test is only authorized for the duration of time the declaration that circumstances exist justifying the authorization of the emergency use of in vitro diagnostic tests for detection of SARS-CoV-2 virus and/or diagnosis of COVID-19 infection under section 564(b)(1) of the Act, 21 U.S.C. 824MPN-3(I) (1), unless the authorization is terminated or revoked sooner. When diagnostic testing is negative, the possibility of a false negative result should be considered in the context of a patient's recent exposures and the presence of clinical signs and symptoms consistent with COVID-19. An individual without symptoms of COVID-19 and who is not shedding  SARS-CoV-2 virus would                           expect to have a negative (not detected) result in this assay.     Assessment:  Victoria Prince is a 86 y.o. female a history of stage I left breast cancer s/p lumpectomy and sentinel lymph node biopsy on 11/08/2009.  Pathology revealed carcinoma of the breast (no pathology available). DCIS was ER/PR +.  Oncotype DX revealed a score of 28, with an 18% (CI 14-22%) chance of recurrence without systemic treatment.   She received 4 cycles of Taxotere and Cytoxan (TC). She underwent adjuvant radiation, completed 05/2010. She began Femara in 05/2010 and later switched to Arimidex due to  insurance coverage. She completed endocrine therapy in 10/2014.   Bilateral screening mammogram on 12/07/2018 revealed no evidence of malignancy.   CA27.29 has been followed: 12.1 on 04/11/2010, 23.2 on 06/16/2010, 11.8 on 12/12/2010, 24.6 on 112/27/2012, 18.4 on 12/17/2011, 24.0 on 06/09/2012, and 20.2 on 06/27/2013.   Bone density on 10/17/2013 revealed osteopenia with a T-score of -2.2 at the femur neck right. Bone density on 10/23/2014 revealed osteopenia with a T-score of -1.9 at the femur neck. Bone density on 10/26/2016 revealed osteoporosis with a T-score of -3.5 at the forearm radius. Bone density on 01/18/2019 revealed osteoporosis with a T-score of -3.9 in the forearm radius.  She is currently on calcium and Vitamin D.  She discontinued Fosamax.   Symptomatically,   Plan: 1.   Labs today:   2.   Stage I left breast cancer  Review entire medical history, diagnosis and management of stage I breast cancer.   She is s/p lumpectomy, 4 cycles of adjuvant TC, radiation , and 5 years of endocrine therapy (completed 10/2014).  Mammogram on 12/07/2018 revealed no evidence of disease.  Clinically, she is doing well without evidence of recurrent disease.  3.   Osteoporosis  Discuss progression from osteopenia to osteoporosis from 2015 to 2018.  Continue bone density every 2 years (overdue).  Continue calcium and vitamin D.  Consider Prolia.  Schedule bone density on 01/19/2019.  4.   Bilateral mammogram 12/08/2019. 5.   RTC in 1 year for MD assessment, labs (CBC with diff, CMP, CA27.29), and review of mammogram.    I discussed the assessment and treatment plan with the patient.  The patient was provided an opportunity to ask questions and all were answered.  The patient agreed with the plan and demonstrated an understanding of the instructions.  The patient was advised to call back if the symptoms worsen or if the condition fails to improve as anticipated.  I provided 25 minutes of  face-to-face time during this this encounter and > 50% was spent counseling as documented under my assessment and plan.    Lequita Asal, MD, PhD    11/26/2019, 6:24 AM  I, Molly Dorshimer, am acting as Education administrator for [employer]. Mike Gip, MD, PhD.  I, Christy Ehrsam C. Mike Gip, MD, have reviewed the above documentation for accuracy and completeness, and I agree with the above.

## 2019-11-27 ENCOUNTER — Inpatient Hospital Stay: Payer: Medicare HMO | Admitting: Hematology and Oncology

## 2019-12-11 ENCOUNTER — Ambulatory Visit
Admission: RE | Admit: 2019-12-11 | Discharge: 2019-12-11 | Disposition: A | Discharge status: Home / Self Care | Payer: Medicare HMO | Source: Ambulatory Visit | Attending: Hematology and Oncology | Admitting: Hematology and Oncology

## 2019-12-11 ENCOUNTER — Other Ambulatory Visit: Payer: Self-pay

## 2019-12-11 DIAGNOSIS — C50412 Malignant neoplasm of upper-outer quadrant of left female breast: Secondary | ICD-10-CM | POA: Insufficient documentation

## 2019-12-11 DIAGNOSIS — Z170 Estrogen receptor positive status [ER+]: Secondary | ICD-10-CM | POA: Insufficient documentation

## 2019-12-13 ENCOUNTER — Encounter: Payer: Self-pay | Admitting: Hematology and Oncology

## 2019-12-13 ENCOUNTER — Inpatient Hospital Stay: Payer: Medicare HMO

## 2019-12-13 ENCOUNTER — Other Ambulatory Visit: Payer: Self-pay

## 2019-12-13 ENCOUNTER — Inpatient Hospital Stay: Payer: Medicare HMO | Attending: Hematology and Oncology | Admitting: Hematology and Oncology

## 2019-12-13 VITALS — BP 163/76 | HR 72 | Temp 97.1°F | Resp 18 | Ht 61.5 in | Wt 149.5 lb

## 2019-12-13 DIAGNOSIS — Z170 Estrogen receptor positive status [ER+]: Secondary | ICD-10-CM | POA: Diagnosis not present

## 2019-12-13 DIAGNOSIS — B372 Candidiasis of skin and nail: Secondary | ICD-10-CM | POA: Diagnosis not present

## 2019-12-13 DIAGNOSIS — Z923 Personal history of irradiation: Secondary | ICD-10-CM | POA: Diagnosis not present

## 2019-12-13 DIAGNOSIS — M810 Age-related osteoporosis without current pathological fracture: Secondary | ICD-10-CM | POA: Diagnosis not present

## 2019-12-13 DIAGNOSIS — Z853 Personal history of malignant neoplasm of breast: Secondary | ICD-10-CM | POA: Diagnosis not present

## 2019-12-13 DIAGNOSIS — B379 Candidiasis, unspecified: Secondary | ICD-10-CM

## 2019-12-13 DIAGNOSIS — C50412 Malignant neoplasm of upper-outer quadrant of left female breast: Secondary | ICD-10-CM

## 2019-12-13 DIAGNOSIS — Z9221 Personal history of antineoplastic chemotherapy: Secondary | ICD-10-CM | POA: Diagnosis not present

## 2019-12-13 LAB — COMPREHENSIVE METABOLIC PANEL
ALT: 26 U/L (ref 0–44)
AST: 27 U/L (ref 15–41)
Albumin: 4.3 g/dL (ref 3.5–5.0)
Alkaline Phosphatase: 51 U/L (ref 38–126)
Anion gap: 9 (ref 5–15)
BUN: 18 mg/dL (ref 8–23)
CO2: 27 mmol/L (ref 22–32)
Calcium: 9.6 mg/dL (ref 8.9–10.3)
Chloride: 106 mmol/L (ref 98–111)
Creatinine, Ser: 1.33 mg/dL — ABNORMAL HIGH (ref 0.44–1.00)
GFR calc Af Amer: 41 mL/min — ABNORMAL LOW (ref >60)
GFR calc non Af Amer: 35 mL/min — ABNORMAL LOW (ref >60)
Glucose, Bld: 100 mg/dL — ABNORMAL HIGH (ref 70–99)
Potassium: 4.5 mmol/L (ref 3.5–5.1)
Sodium: 142 mmol/L (ref 135–145)
Total Bilirubin: 0.6 mg/dL (ref 0.3–1.2)
Total Protein: 7.2 g/dL (ref 6.5–8.1)

## 2019-12-13 LAB — CBC WITH DIFFERENTIAL/PLATELET
Abs Immature Granulocytes: 0.01 10*3/uL (ref 0.00–0.07)
Basophils Absolute: 0 10*3/uL (ref 0.0–0.1)
Basophils Relative: 0 %
Eosinophils Absolute: 0.1 10*3/uL (ref 0.0–0.5)
Eosinophils Relative: 1 %
HCT: 46.2 % — ABNORMAL HIGH (ref 36.0–46.0)
Hemoglobin: 14.9 g/dL (ref 12.0–15.0)
Immature Granulocytes: 0 %
Lymphocytes Relative: 38 %
Lymphs Abs: 2 10*3/uL (ref 0.7–4.0)
MCH: 31 pg (ref 26.0–34.0)
MCHC: 32.3 g/dL (ref 30.0–36.0)
MCV: 96.3 fL (ref 80.0–100.0)
Monocytes Absolute: 0.3 10*3/uL (ref 0.1–1.0)
Monocytes Relative: 6 %
Neutro Abs: 2.8 10*3/uL (ref 1.7–7.7)
Neutrophils Relative %: 55 %
Platelets: 157 10*3/uL (ref 150–400)
RBC: 4.8 MIL/uL (ref 3.87–5.11)
RDW: 13.9 % (ref 11.5–15.5)
WBC: 5.2 10*3/uL (ref 4.0–10.5)
nRBC: 0 % (ref 0.0–0.2)

## 2019-12-13 MED ORDER — NYSTATIN 100000 UNIT/GM EX POWD: 1.0000 "application " | Freq: Three times a day (TID) | CUTANEOUS | 1 refills | Status: DC

## 2019-12-13 NOTE — Progress Notes (Signed)
B/P 170/76 today on right forearm  Repeat b/p today Left forearm 163/76

## 2019-12-13 NOTE — Progress Notes (Signed)
[facility]  [address], [address] [place], [place] [address] Phone: [phone]  Fax: [phone]   Clinic Day:  12/13/2019  Referring physician: Sofie Hartigan, MD  Chief Complaint: AMBUR PROVINCE is a 86 y.o. female with a history of stage I left breast cancer who is seen at the patient's request for a new health issue.  HPI: The patient was last seen in the medical oncology clinic on 01/16/2019 for new patient assessment.  She was s/p lumpectomy, 4 cycles of adjuvant TC, radiation, and 5 years of endocrine therapy (completed 10/2014).  Mammogram on 12/07/2018 revealed no evidence of disease.  She had no evidence of recurrent disease.   At last visit, she was noted to have progression from osteopenia to osteoporosis from 2013-10-12 to 10/12/2016.  She was overdue for a bone density study.  She was to continue calcium and vitamin D.  We discussed consideration of Prolia.  Bone density on 01/18/2019 revealed osteoporosis with a T-score of -3.9 in the forearm radius.  During the interim, she has been "pretty good."  Three weeks ago, her right breast became heavy, red, and hot to the touch. On the third day, she noticed spots of blood on her bra that had come from her nipple. These symptoms lasted for 5-6 days and went away on their own. She went to get a mammogram on Monday, but they told her to get checked first. Denies shortness of breath, cough, and chest pain. She has a "raw" spot on the dorsal aspect of left hand.  Depending on what she eats, she experiences constipation and diarrhea. She has not been able to figure out what causes this, except for dairy. She is still having arthritis in both knees.  The patient got both of her COVID-19 vaccines in 10/2019.   Past Medical History:  Diagnosis Date  . Breast cancer ([place]) 10/12/2009   left breast lumpectomy with rad and chemo tx  . Personal history of chemotherapy 10-12-09   left breast  . Personal history of radiation  therapy 2009-10-12   left breast  . Radiation 2009-10-12  . S/P chemotherapy, time since greater than 12 weeks 10-12-2009    Past Surgical History:  Procedure Laterality Date  . BREAST BIOPSY Left 10/13/10   negative  . BREAST BIOPSY Left 10-12-09   2 areas removed. Retroaerola area benign.  UOQ was DCIS with positive margin per Dr. Rogue Bussing notes.     Family History  Problem Relation Age of Onset  . Breast cancer Neg Hx     Social History:  reports that she has never smoked. She has never used smokeless tobacco. No history on file for alcohol use and drug use. She does not smoke or drink alcohol. She denies any known exposure to radiation or toxins. She did not work outside the home. Her husband passed away in 10/12/17. She lives in [place] with her son. The patient is alone today.  Allergies:  Allergies  Allergen Reactions  . Sulfa Antibiotics Rash    Current Medications: Current Outpatient Medications  Medication Sig Dispense Refill  . alendronate (FOSAMAX) 70 MG tablet Take 1 tablet (70 mg total) by mouth once a week. Take with a full glass of water on an empty stomach; sit upright for 2 hours. . 30 tablet 1  . amitriptyline (ELAVIL) 25 MG tablet Take 25 mg by mouth at bedtime.     Marland Kitchen amLODipine (NORVASC) 2.5 MG tablet Take 2.5 mg by mouth daily.     Marland Kitchen  clobetasol cream (TEMOVATE) 1.49 % Apply 1 Application topically as needed.     Marland Kitchen levocetirizine (XYZAL) 5 MG tablet Take 10 mg by mouth every evening.    . pravastatin (PRAVACHOL) 40 MG tablet Take 40 mg by mouth daily.      No current facility-administered medications for this visit.    Review of Systems  Constitutional: Positive for weight loss (1 lb). Negative for chills, diaphoresis, fever and malaise/fatigue.       Doing "pretty good."  HENT: Negative for congestion, ear discharge, ear pain, hearing loss, nosebleeds, sinus pain, sore throat and tinnitus.   Eyes: Negative for blurred vision.  Respiratory: Negative for cough, hemoptysis, sputum  production and shortness of breath.   Cardiovascular: Negative for chest pain, palpitations and leg swelling.  Gastrointestinal: Positive for constipation (depending on what she eats) and diarrhea (depending on what she eats). Negative for abdominal pain, blood in stool, heartburn, melena, nausea and vomiting.  Genitourinary: Negative for dysuria, frequency, hematuria and urgency.  Musculoskeletal: Positive for joint pain (knee arthritis, B/L).  Skin:       Interval history of red and heavy right breast with bloody nipple discharge.  Symptoms lasted 5-6 days.  "Raw" spot on dorsal aspect of left hand.  Neurological: Negative for dizziness, tingling, sensory change, weakness and headaches.  Endo/Heme/Allergies: Positive for environmental allergies. Does not bruise/bleed easily.  Psychiatric/Behavioral: Positive for memory loss. Negative for depression. The patient is not nervous/anxious and does not have insomnia.   All other systems reviewed and are negative.  Performance status (ECOG): 0-1  Vitals Blood pressure (!) 163/76, pulse 72, temperature (!) 97.1 F (36.2 C), temperature source Tympanic, resp. rate 18, height 5' 1.5" (1.562 m), weight 149 lb 7.6 oz (67.8 kg), SpO2 99 %.   Physical Exam Vitals and nursing note reviewed.  Constitutional:      General: She is not in acute distress.    Appearance: She is not diaphoretic.  HENT:     Head: Normocephalic and atraumatic.     Comments: Short styled gray hair.  Mask.    Mouth/Throat:     Mouth: Mucous membranes are moist.     Pharynx: Oropharynx is clear.  Eyes:     General: No scleral icterus.    Extraocular Movements: Extraocular movements intact.     Conjunctiva/sclera: Conjunctivae normal.     Pupils: Pupils are equal, round, and reactive to light.  Cardiovascular:     Rate and Rhythm: Normal rate and regular rhythm.     Pulses: Normal pulses.     Heart sounds: Normal heart sounds. No murmur heard.   Pulmonary:     Effort:  Pulmonary effort is normal. No respiratory distress.     Breath sounds: Normal breath sounds. No wheezing or rales.  Chest:     Chest wall: No tenderness.     Breasts:        Right: Inverted nipple, skin change (fibrocystic changes at 2 o'clock and 10 o'clock position) and tenderness present. No swelling, mass or nipple discharge.        Left: Inverted nipple and skin change (s/p left lumpectomy) present. No swelling, mass or nipple discharge.     Comments: Moisture and candida infection underneath both breasts. Abdominal:     General: Bowel sounds are normal. There is no distension.     Palpations: There is no mass.     Tenderness: There is no abdominal tenderness. There is no guarding or rebound.  Musculoskeletal:  General: No swelling or tenderness. Normal range of motion.     Cervical back: Normal range of motion and neck supple.  Lymphadenopathy:     Head:     Right side of head: No preauricular, posterior auricular or occipital adenopathy.     Left side of head: No preauricular, posterior auricular or occipital adenopathy.     Cervical: No cervical adenopathy.     Upper Body:     Right upper body: No supraclavicular or axillary adenopathy.     Left upper body: No supraclavicular or axillary adenopathy.     Lower Body: No right inguinal adenopathy. No left inguinal adenopathy.  Skin:    General: Skin is warm and dry.  Neurological:     Mental Status: She is alert and oriented to person, place, and time.  Psychiatric:        Mood and Affect: Mood normal.        Behavior: Behavior normal.        Thought Content: Thought content normal.        Judgment: Judgment normal.    No visits with results within 3 Day(s) from this visit.  Latest known visit with results is:  Lab on 07/05/2019  Component Date Value Ref Range Status  . SARS-CoV-2, NAA 07/05/2019 Not Detected  Not Detected Final   Comment: This nucleic acid amplification test was developed and its  performance characteristics determined by [employer]. Nucleic acid amplification tests include PCR and TMA. This test has not been FDA cleared or approved. This test has been authorized by FDA under an Emergency Use Authorization (EUA). This test is only authorized for the duration of time the declaration that circumstances exist justifying the authorization of the emergency use of in vitro diagnostic tests for detection of SARS-CoV-2 virus and/or diagnosis of COVID-19 infection under section 564(b)(1) of the Act, 21 U.S.C. 833ASN-0(N) (1), unless the authorization is terminated or revoked sooner. When diagnostic testing is negative, the possibility of a false negative result should be considered in the context of a patient's recent exposures and the presence of clinical signs and symptoms consistent with COVID-19. An individual without symptoms of COVID-19 and who is not shedding SARS-CoV-2 virus would                           expect to have a negative (not detected) result in this assay.     Assessment:  Victoria Prince is a 86 y.o. female a history of stage I left breast cancer s/p lumpectomy and sentinel lymph node biopsy on 11/08/2009.  Pathology revealed carcinoma of the breast (no pathology available). DCIS was ER/PR +.  Oncotype DX revealed a score of 28, with an 18% (CI 14-22%) chance of recurrence without systemic treatment.   She received 4 cycles of Taxotere and Cytoxan (TC). She underwent adjuvant radiation, completed 05/2010. She began Femara in 05/2010 and later switched to Arimidex due to insurance coverage. She completed endocrine therapy in 10/2014.   Bilateral screening mammogram on 12/07/2018 revealed no evidence of malignancy.   CA27.29 has been followed: 12.1 on 04/11/2010, 23.2 on 06/16/2010, 11.8 on 12/12/2010, 24.6 on 112/27/2012, 18.4 on 12/17/2011, 24.0 on 06/09/2012, 20.2 on 06/27/2013, 11.2 on 01/16/2019, and 17 on 12/13/2019.   Bone density  on 10/17/2013 revealed osteopenia with a T-score of -2.2 at the femur neck right. Bone density on 10/23/2014 revealed osteopenia with a T-score of -1.9 at the femur neck. Bone density  on 10/26/2016 revealed osteoporosis with a T-score of -3.5 at the forearm radius. Bone density on 01/18/2019 revealed osteoporosis with a T-score of -3.9 in the forearm radius.  She is currently on calcium and Vitamin D.  She discontinued Fosamax.   The patient received her COVID-19 vaccines in 10/2019.  Symptomatically, she has an interval 5 to 6-day history of swollen red right breast suggestive of mastitis.  She has a fungal infection beneath her breast.  Breast exam today is unremarkable without nipple discharge.  Plan: 1.   Labs today:  CBC with diff, CMP, CA27.29. 2.   Stage I left breast cancer  She is s/p lumpectomy, 4 cycles of adjuvant TC followed by radiation.  She completed endocrine therapy in 10/2014.  Mammogram on 12/07/2018 revealed no evidence of disease.  Clinically, she appears to be doing well.   Interval history is suggestive of mastitis.  Discuss diagnostic mammogram and ultrasound.  Discuss immediate assessment if symptoms recur or any concern with breasts. 3.   Osteoporosis  Bone density study on 01/18/2019 confirms osteoporosis with a T score of -3.9.  Continue calcium and vitamin D.  Consider Prolia.  Dental clearance needed. 4.   Candida skin infection  Rx: Nystatin powder topically. 5.   Bilateral diagnostic mammogram and right sided ultrasound on 12/20/2019. 6.   RTC in 1 year for MD assessment and labs (CBC with diff, CMP, CA27.29).   I discussed the assessment and treatment plan with the patient.  The patient was provided an opportunity to ask questions and all were answered.  The patient agreed with the plan and demonstrated an understanding of the instructions.  The patient was advised to call back if the symptoms worsen or if the condition fails to improve as anticipated.  I  provided 15 minutes of face-to-face time during this encounter and > 50% was spent counseling as documented under my assessment and plan. An additional 8 minutes were spent reviewing her chart (Epic and Care Everywhere) including notes, labs, and imaging studies.    Lequita Asal, MD, PhD    12/13/2019, 1:44 PM  I, De Burrs, am acting as scribe for [employer]. Mike Gip, MD, PhD.  I, Fareeda Downard C. Mike Gip, MD, have reviewed the above documentation for accuracy and completeness, and I agree with the above

## 2019-12-15 LAB — CANCER ANTIGEN 27.29: CA 27.29: 17 U/mL (ref 0.0–38.6)

## 2019-12-20 ENCOUNTER — Ambulatory Visit
Admission: RE | Admit: 2019-12-20 | Discharge: 2019-12-20 | Disposition: A | Discharge status: Home / Self Care | Payer: Medicare HMO | Source: Ambulatory Visit | Attending: Hematology and Oncology | Admitting: Hematology and Oncology

## 2019-12-20 DIAGNOSIS — Z170 Estrogen receptor positive status [ER+]: Secondary | ICD-10-CM | POA: Insufficient documentation

## 2019-12-20 DIAGNOSIS — C50412 Malignant neoplasm of upper-outer quadrant of left female breast: Secondary | ICD-10-CM | POA: Diagnosis present

## 2019-12-21 ENCOUNTER — Other Ambulatory Visit: Payer: Self-pay | Admitting: Hematology and Oncology

## 2019-12-21 DIAGNOSIS — N6310 Unspecified lump in the right breast, unspecified quadrant: Secondary | ICD-10-CM

## 2019-12-21 DIAGNOSIS — R928 Other abnormal and inconclusive findings on diagnostic imaging of breast: Secondary | ICD-10-CM

## 2019-12-26 ENCOUNTER — Ambulatory Visit
Admission: RE | Admit: 2019-12-26 | Discharge: 2019-12-26 | Disposition: A | Discharge status: Home / Self Care | Payer: Medicare HMO | Source: Ambulatory Visit | Attending: Hematology and Oncology | Admitting: Hematology and Oncology

## 2019-12-26 DIAGNOSIS — N6310 Unspecified lump in the right breast, unspecified quadrant: Secondary | ICD-10-CM

## 2019-12-26 DIAGNOSIS — R928 Other abnormal and inconclusive findings on diagnostic imaging of breast: Secondary | ICD-10-CM

## 2019-12-26 HISTORY — PX: BREAST BIOPSY: SHX20

## 2019-12-28 LAB — SURGICAL PATHOLOGY

## 2020-01-15 ENCOUNTER — Ambulatory Visit: Payer: Medicare HMO | Admitting: Hematology and Oncology

## 2020-01-15 ENCOUNTER — Other Ambulatory Visit: Payer: Medicare HMO

## 2020-11-22 ENCOUNTER — Other Ambulatory Visit: Payer: Self-pay | Admitting: Family Medicine

## 2020-11-22 DIAGNOSIS — Z1231 Encounter for screening mammogram for malignant neoplasm of breast: Secondary | ICD-10-CM

## 2020-12-16 ENCOUNTER — Ambulatory Visit: Payer: Medicare HMO | Admitting: Hematology and Oncology

## 2020-12-16 ENCOUNTER — Other Ambulatory Visit: Payer: Medicare HMO

## 2020-12-24 ENCOUNTER — Ambulatory Visit
Admission: RE | Admit: 2020-12-24 | Discharge: 2020-12-24 | Disposition: A | Discharge status: Home / Self Care | Payer: Medicare HMO | Source: Ambulatory Visit | Attending: Family Medicine | Admitting: Family Medicine

## 2020-12-24 ENCOUNTER — Other Ambulatory Visit: Payer: Self-pay

## 2020-12-24 DIAGNOSIS — Z1231 Encounter for screening mammogram for malignant neoplasm of breast: Secondary | ICD-10-CM | POA: Insufficient documentation

## 2020-12-30 ENCOUNTER — Other Ambulatory Visit: Payer: Self-pay | Admitting: *Deleted

## 2020-12-30 DIAGNOSIS — C50412 Malignant neoplasm of upper-outer quadrant of left female breast: Secondary | ICD-10-CM

## 2020-12-31 ENCOUNTER — Encounter: Payer: Self-pay | Admitting: Internal Medicine

## 2020-12-31 ENCOUNTER — Other Ambulatory Visit: Payer: Self-pay

## 2020-12-31 ENCOUNTER — Inpatient Hospital Stay: Payer: Medicare HMO | Attending: Internal Medicine

## 2020-12-31 ENCOUNTER — Inpatient Hospital Stay (HOSPITAL_BASED_OUTPATIENT_CLINIC_OR_DEPARTMENT_OTHER): Payer: Medicare HMO | Admitting: Internal Medicine

## 2020-12-31 VITALS — BP 155/78 | HR 70 | Temp 97.4°F | Resp 16 | Wt 147.6 lb

## 2020-12-31 DIAGNOSIS — Z853 Personal history of malignant neoplasm of breast: Secondary | ICD-10-CM | POA: Diagnosis not present

## 2020-12-31 DIAGNOSIS — Z9221 Personal history of antineoplastic chemotherapy: Secondary | ICD-10-CM | POA: Diagnosis not present

## 2020-12-31 DIAGNOSIS — M810 Age-related osteoporosis without current pathological fracture: Secondary | ICD-10-CM | POA: Insufficient documentation

## 2020-12-31 DIAGNOSIS — N183 Chronic kidney disease, stage 3 unspecified: Secondary | ICD-10-CM | POA: Diagnosis not present

## 2020-12-31 DIAGNOSIS — Z923 Personal history of irradiation: Secondary | ICD-10-CM | POA: Insufficient documentation

## 2020-12-31 DIAGNOSIS — C50412 Malignant neoplasm of upper-outer quadrant of left female breast: Secondary | ICD-10-CM

## 2020-12-31 DIAGNOSIS — Z170 Estrogen receptor positive status [ER+]: Secondary | ICD-10-CM

## 2020-12-31 LAB — COMPREHENSIVE METABOLIC PANEL
ALT: 25 U/L (ref 0–44)
AST: 29 U/L (ref 15–41)
Albumin: 4.1 g/dL (ref 3.5–5.0)
Alkaline Phosphatase: 45 U/L (ref 38–126)
Anion gap: 6 (ref 5–15)
BUN: 15 mg/dL (ref 8–23)
CO2: 27 mmol/L (ref 22–32)
Calcium: 9.1 mg/dL (ref 8.9–10.3)
Chloride: 105 mmol/L (ref 98–111)
Creatinine, Ser: 1.29 mg/dL — ABNORMAL HIGH (ref 0.44–1.00)
GFR, Estimated: 39 mL/min — ABNORMAL LOW (ref >60)
Glucose, Bld: 97 mg/dL (ref 70–99)
Potassium: 4.2 mmol/L (ref 3.5–5.1)
Sodium: 138 mmol/L (ref 135–145)
Total Bilirubin: 0.6 mg/dL (ref 0.3–1.2)
Total Protein: 7.4 g/dL (ref 6.5–8.1)

## 2020-12-31 LAB — CBC WITH DIFFERENTIAL/PLATELET
Abs Immature Granulocytes: 0.01 10*3/uL (ref 0.00–0.07)
Basophils Absolute: 0 10*3/uL (ref 0.0–0.1)
Basophils Relative: 0 %
Eosinophils Absolute: 0.1 10*3/uL (ref 0.0–0.5)
Eosinophils Relative: 2 %
HCT: 44.2 % (ref 36.0–46.0)
Hemoglobin: 14.6 g/dL (ref 12.0–15.0)
Immature Granulocytes: 0 %
Lymphocytes Relative: 46 %
Lymphs Abs: 2.1 10*3/uL (ref 0.7–4.0)
MCH: 31.7 pg (ref 26.0–34.0)
MCHC: 33 g/dL (ref 30.0–36.0)
MCV: 96.1 fL (ref 80.0–100.0)
Monocytes Absolute: 0.4 10*3/uL (ref 0.1–1.0)
Monocytes Relative: 8 %
Neutro Abs: 2.1 10*3/uL (ref 1.7–7.7)
Neutrophils Relative %: 44 %
Platelets: 170 10*3/uL (ref 150–400)
RBC: 4.6 MIL/uL (ref 3.87–5.11)
RDW: 13.9 % (ref 11.5–15.5)
WBC: 4.7 10*3/uL (ref 4.0–10.5)
nRBC: 0 % (ref 0.0–0.2)

## 2020-12-31 NOTE — Assessment & Plan Note (Addendum)
#   Breast ca er/pr- pos; her 2 neu neg; high risk oncotype s/p TC [2012] s/p arimidex [finished may 2016]. Mammogram JULY 2022- NEG. Clinically no NED. STABLE.   # Bone density OSTEOPOROSIS [2022]- T score= -3.9  Patient currently not on calcium vitamin D/ or fosamax. Stable; Discussed the benefits of reclast in improving /treating osteoporosis. Discussed the potential side effects including but not limited to hypocalcemia osteonecrosis of the jaw.  Discussed regarding avoiding dental extraction around the time of reclast. Pt will inform us if she is interested.   # CKD-III- GFR 39-STABLE;  monitor for now.  Stable.  #DISPOSITION: # follow up labs/ MD; labs- cbc/cmp/mammogram/BMD  in 1 year.

## 2020-12-31 NOTE — Progress Notes (Signed)
[place] CONSULT NOTE  Patient Care Team: Sofie Hartigan, MD as PCP - General (Family Medicine) Lequita Asal, MD as Referring Physician (Hematology and Oncology)  CHIEF COMPLAINTS/PURPOSE OF CONSULTATION: Breast cancer  #  Oncology History Overview Note  # 2011-  Carcinoma of breast status post lumpectomyestrogen receptor positive, progesterone receptor positive.  Positive margin with ductal carcinoma in situ. [Dr.Smith] 2. Oncotype DX score is 28 with recurring score is high in between 14%-22% on average 18 %. 3. Cytoxan and Taxotere for 4 cycle 4. Followed by radiation therapy finished in December of 2011  5. Started on Femara.  December of 2011 6. Changed to Arimidex (insurance request) 7/finishing Arimidex  in May of 2016      Cancer of breast [facility])  Carcinoma of upper-outer quadrant of left breast in female, estrogen receptor positive ([place])  01/20/2016 Initial Diagnosis   Malignant neoplasm of upper-outer quadrant of left female breast ([place])       HISTORY OF PRESENTING ILLNESS:  Victoria Prince 87 y.o.  female with a history of stage I ER/PR positive breast cancer currently on surveillance is here for follow-up.  Patient denies any new lumps or bumps.  Denies any nausea vomiting abdominal pain.  Denies any cough.  Chronic arthritic pain not any worse.   Review of Systems  Constitutional:  Negative for chills, diaphoresis, fever, malaise/fatigue and weight loss.  HENT:  Negative for nosebleeds and sore throat.   Eyes:  Negative for double vision.  Respiratory:  Negative for cough, hemoptysis, sputum production, shortness of breath and wheezing.   Cardiovascular:  Negative for chest pain, palpitations, orthopnea and leg swelling.  Gastrointestinal:  Negative for abdominal pain, blood in stool, constipation, diarrhea, heartburn, melena, nausea and vomiting.  Genitourinary:  Negative for dysuria, frequency and urgency.  Musculoskeletal:   Positive for joint pain. Negative for back pain.  Skin: Negative.  Negative for itching and rash.  Neurological:  Negative for dizziness, tingling, focal weakness, weakness and headaches.  Endo/Heme/Allergies:  Does not bruise/bleed easily.  Psychiatric/Behavioral:  Negative for depression. The patient is not nervous/anxious and does not have insomnia.     MEDICAL HISTORY:  Past Medical History:  Diagnosis Date   Breast cancer ([place]) 2011   left breast lumpectomy with rad and chemo tx   Personal history of chemotherapy 2011   left breast   Personal history of radiation therapy 2011   left breast   Radiation 2011   S/P chemotherapy, time since greater than 12 weeks 2011    SURGICAL HISTORY: Past Surgical History:  Procedure Laterality Date   BREAST BIOPSY Left 2012   negative   BREAST BIOPSY Left 2011   2 areas removed. Retroaerola area benign.  UOQ was DCIS with positive margin per Dr. Rogue Bussing notes.    BREAST BIOPSY Right 12/26/2019   [place] bx, Q marker, path pending    SOCIAL HISTORY: Social History   Socioeconomic History   Marital status: Married    Spouse name: Not on file   Number of children: Not on file   Years of education: Not on file   Highest education level: Not on file  Occupational History   Not on file  Tobacco Use   Smoking status: Never   Smokeless tobacco: Never  Vaping Use   Vaping Use: Never used  Substance and Sexual Activity   Alcohol use: Not on file   Drug use: Not on file   Sexual activity: Not on file  Other Topics Concern   Not on file  Social History Narrative   Not on file   Social Determinants of Health   Financial Resource Strain: Not on file  Food Insecurity: Not on file  Transportation Needs: Not on file  Physical Activity: Not on file  Stress: Not on file  Social Connections: Not on file  Intimate Partner Violence: Not on file    FAMILY HISTORY: Family History  Problem Relation Age of Onset   Breast cancer Neg Hx      ALLERGIES:  is allergic to sulfa antibiotics.  MEDICATIONS:  Current Outpatient Medications  Medication Sig Dispense Refill   amitriptyline (ELAVIL) 25 MG tablet Take 25 mg by mouth at bedtime.      amLODipine (NORVASC) 2.5 MG tablet Take 2.5 mg by mouth daily.      cetirizine (ZYRTEC) 10 MG tablet Take 1 tablet by mouth daily.     clobetasol cream (TEMOVATE) 5.46 % Apply 1 Application topically as needed.      hydrocortisone (ANUSOL-HC) 25 MG suppository Place 25 mg rectally 2 (two) times daily.     levocetirizine (XYZAL) 5 MG tablet Take 10 mg by mouth every evening.     naproxen sodium (ALEVE) 220 MG tablet Take by mouth.     nystatin (MYCOSTATIN/NYSTOP) powder Apply 1 application topically 3 (three) times daily. 15 g 1   pravastatin (PRAVACHOL) 40 MG tablet Take 40 mg by mouth daily.      alendronate (FOSAMAX) 70 MG tablet Take 1 tablet (70 mg total) by mouth once a week. Take with a full glass of water on an empty stomach; sit upright for 2 hours. . (Patient not taking: Reported on 12/31/2020) 30 tablet 1   triamcinolone cream (KENALOG) 0.5 % Apply topically as directed.     No current facility-administered medications for this visit.      Marland Kitchen  PHYSICAL EXAMINATION: ECOG PERFORMANCE STATUS: 0 - Asymptomatic  Vitals:   12/31/20 0856  BP: (!) 155/78  Pulse: 70  Resp: 16  Temp: (!) 97.4 F (36.3 C)  SpO2: 99%   Filed Weights   12/31/20 0856  Weight: 147 lb 9.6 oz (67 kg)    Physical Exam Vitals and nursing note reviewed.  Constitutional:      Comments: Ambulating: independently with a walker wheelchair  Alone with family  HENT:     Head: Normocephalic and atraumatic.     Mouth/Throat:     Pharynx: Oropharynx is clear.  Eyes:     Extraocular Movements: Extraocular movements intact.     Pupils: Pupils are equal, round, and reactive to light.  Cardiovascular:     Rate and Rhythm: Normal rate and regular rhythm.  Pulmonary:     Comments: Decreased breath  sounds bilaterally.  Abdominal:     Palpations: Abdomen is soft.  Musculoskeletal:        General: Normal range of motion.     Cervical back: Normal range of motion.  Skin:    General: Skin is warm.  Neurological:     General: No focal deficit present.     Mental Status: She is alert and oriented to person, place, and time.  Psychiatric:        Behavior: Behavior normal.        Judgment: Judgment normal.    LABORATORY DATA:  I have reviewed the data as listed Lab Results  Component Value Date   WBC 4.7 12/31/2020   HGB 14.6 12/31/2020   HCT 44.2  12/31/2020   MCV 96.1 12/31/2020   PLT 170 12/31/2020   Recent Labs    12/31/20 0851  NA 138  K 4.2  CL 105  CO2 27  GLUCOSE 97  BUN 15  CREATININE 1.29*  CALCIUM 9.1  GFRNONAA 39*  PROT 7.4  ALBUMIN 4.1  AST 29  ALT 25  ALKPHOS 45  BILITOT 0.6    RADIOGRAPHIC STUDIES: I have personally reviewed the radiological images as listed and agreed with the findings in the report. MM 3D SCREEN BREAST BILATERAL  Result Date: 12/25/2020 CLINICAL DATA:  Screening. EXAM: DIGITAL SCREENING BILATERAL MAMMOGRAM WITH TOMOSYNTHESIS AND CAD TECHNIQUE: Bilateral screening digital craniocaudal and mediolateral oblique mammograms were obtained. Bilateral screening digital breast tomosynthesis was performed. The images were evaluated with computer-aided detection. COMPARISON:  Previous exam(s). ACR Breast Density Category b: There are scattered areas of fibroglandular density. FINDINGS: There are no findings suspicious for malignancy. IMPRESSION: No mammographic evidence of malignancy. A result letter of this screening mammogram will be mailed directly to the patient. RECOMMENDATION: Screening mammogram in one year. (Code:SM-B-01Y) BI-RADS CATEGORY  1: Negative. Electronically Signed   By: Lillia Mountain M.D.   On: 12/25/2020 14:42   ASSESSMENT & PLAN:   Carcinoma of upper-outer quadrant of left breast in female, estrogen receptor positive ([place]) #  Breast ca er/pr- pos; her 2 neu neg; high risk oncotype s/p TC [2012] s/p arimidex [finished may 2016]. Mammogram JULY 2022- NEG. Clinically no NED. STABLE.   # Bone density OSTEOPOROSIS [2022]- T score= -3.9  Patient currently not on calcium vitamin D/ or fosamax. Stable; Discussed the benefits of reclast in improving /treating osteoporosis. Discussed the potential side effects including but not limited to hypocalcemia osteonecrosis of the jaw.  Discussed regarding avoiding dental extraction around the time of reclast. Pt will inform us if she is interested.   # CKD-III- GFR 39-STABLE;  monitor for now.  Stable.  #DISPOSITION: # follow up labs/ MD; labs- cbc/cmp/mammogram/BMD  in 1 year.     All questions were answered. The patient knows to call the clinic with any problems, questions or concerns.       Cammie Sickle, MD 12/31/2020 10:09 AM

## 2021-01-01 LAB — CANCER ANTIGEN 27.29: CA 27.29: 17.7 U/mL (ref 0.0–38.6)

## 2021-06-22 HISTORY — PX: SKIN LESION EXCISION: SHX2412

## 2021-07-03 ENCOUNTER — Telehealth: Payer: Self-pay | Admitting: Internal Medicine

## 2021-07-03 NOTE — Telephone Encounter (Signed)
WE are waiting on MD to read and figure out if she needs to come here or something else.

## 2021-07-03 NOTE — Telephone Encounter (Signed)
Pt called to set up appt to see MD. Call back at [phone]

## 2021-07-07 NOTE — Telephone Encounter (Signed)
Dr. Jacinto Reap, please review records from dermatology scanned in chart.  Patient is scheduled for excision on 07/14/21.  Does she need to be scheduled to see you for this issue?

## 2021-07-07 NOTE — Telephone Encounter (Signed)
Patient has been notified

## 2021-12-25 ENCOUNTER — Ambulatory Visit
Admission: RE | Admit: 2021-12-25 | Discharge: 2021-12-25 | Disposition: A | Discharge status: Home / Self Care | Payer: Medicare HMO | Source: Ambulatory Visit | Attending: Internal Medicine | Admitting: Internal Medicine

## 2021-12-25 DIAGNOSIS — Z13820 Encounter for screening for osteoporosis: Secondary | ICD-10-CM | POA: Diagnosis not present

## 2021-12-25 DIAGNOSIS — M810 Age-related osteoporosis without current pathological fracture: Secondary | ICD-10-CM

## 2021-12-25 DIAGNOSIS — Z1231 Encounter for screening mammogram for malignant neoplasm of breast: Secondary | ICD-10-CM | POA: Insufficient documentation

## 2021-12-25 DIAGNOSIS — Z923 Personal history of irradiation: Secondary | ICD-10-CM | POA: Insufficient documentation

## 2021-12-25 DIAGNOSIS — C50412 Malignant neoplasm of upper-outer quadrant of left female breast: Secondary | ICD-10-CM | POA: Insufficient documentation

## 2021-12-25 DIAGNOSIS — Z853 Personal history of malignant neoplasm of breast: Secondary | ICD-10-CM | POA: Diagnosis not present

## 2021-12-25 DIAGNOSIS — Z170 Estrogen receptor positive status [ER+]: Secondary | ICD-10-CM | POA: Insufficient documentation

## 2021-12-25 DIAGNOSIS — Z9221 Personal history of antineoplastic chemotherapy: Secondary | ICD-10-CM | POA: Diagnosis not present

## 2021-12-25 DIAGNOSIS — Z780 Asymptomatic menopausal state: Secondary | ICD-10-CM | POA: Insufficient documentation

## 2021-12-30 ENCOUNTER — Inpatient Hospital Stay: Payer: Medicare HMO | Admitting: Internal Medicine

## 2021-12-30 ENCOUNTER — Encounter: Payer: Self-pay | Admitting: Internal Medicine

## 2021-12-30 ENCOUNTER — Inpatient Hospital Stay: Payer: Medicare HMO | Attending: Internal Medicine

## 2021-12-30 DIAGNOSIS — Z923 Personal history of irradiation: Secondary | ICD-10-CM | POA: Diagnosis not present

## 2021-12-30 DIAGNOSIS — Z853 Personal history of malignant neoplasm of breast: Secondary | ICD-10-CM | POA: Insufficient documentation

## 2021-12-30 DIAGNOSIS — M810 Age-related osteoporosis without current pathological fracture: Secondary | ICD-10-CM | POA: Diagnosis not present

## 2021-12-30 DIAGNOSIS — Z170 Estrogen receptor positive status [ER+]: Secondary | ICD-10-CM | POA: Diagnosis not present

## 2021-12-30 DIAGNOSIS — C50412 Malignant neoplasm of upper-outer quadrant of left female breast: Secondary | ICD-10-CM

## 2021-12-30 DIAGNOSIS — N183 Chronic kidney disease, stage 3 unspecified: Secondary | ICD-10-CM | POA: Diagnosis not present

## 2021-12-30 DIAGNOSIS — Z9221 Personal history of antineoplastic chemotherapy: Secondary | ICD-10-CM | POA: Insufficient documentation

## 2021-12-30 LAB — COMPREHENSIVE METABOLIC PANEL
ALT: 24 U/L (ref 0–44)
AST: 30 U/L (ref 15–41)
Albumin: 4 g/dL (ref 3.5–5.0)
Alkaline Phosphatase: 53 U/L (ref 38–126)
Anion gap: 4 — ABNORMAL LOW (ref 5–15)
BUN: 15 mg/dL (ref 8–23)
CO2: 27 mmol/L (ref 22–32)
Calcium: 8.9 mg/dL (ref 8.9–10.3)
Chloride: 107 mmol/L (ref 98–111)
Creatinine, Ser: 1.25 mg/dL — ABNORMAL HIGH (ref 0.44–1.00)
GFR, Estimated: 41 mL/min — ABNORMAL LOW (ref >60)
Glucose, Bld: 84 mg/dL (ref 70–99)
Potassium: 4.1 mmol/L (ref 3.5–5.1)
Sodium: 138 mmol/L (ref 135–145)
Total Bilirubin: 0.5 mg/dL (ref 0.3–1.2)
Total Protein: 6.7 g/dL (ref 6.5–8.1)

## 2021-12-30 LAB — CBC WITH DIFFERENTIAL/PLATELET
Abs Immature Granulocytes: 0 10*3/uL (ref 0.00–0.07)
Basophils Absolute: 0 10*3/uL (ref 0.0–0.1)
Basophils Relative: 0 %
Eosinophils Absolute: 0.1 10*3/uL (ref 0.0–0.5)
Eosinophils Relative: 2 %
HCT: 46.1 % — ABNORMAL HIGH (ref 36.0–46.0)
Hemoglobin: 15 g/dL (ref 12.0–15.0)
Immature Granulocytes: 0 %
Lymphocytes Relative: 47 %
Lymphs Abs: 2.2 10*3/uL (ref 0.7–4.0)
MCH: 31.9 pg (ref 26.0–34.0)
MCHC: 32.5 g/dL (ref 30.0–36.0)
MCV: 98.1 fL (ref 80.0–100.0)
Monocytes Absolute: 0.3 10*3/uL (ref 0.1–1.0)
Monocytes Relative: 7 %
Neutro Abs: 2 10*3/uL (ref 1.7–7.7)
Neutrophils Relative %: 44 %
Platelets: 151 10*3/uL (ref 150–400)
RBC: 4.7 MIL/uL (ref 3.87–5.11)
RDW: 13.7 % (ref 11.5–15.5)
WBC: 4.6 10*3/uL (ref 4.0–10.5)
nRBC: 0 % (ref 0.0–0.2)

## 2021-12-30 MED ORDER — ALENDRONATE SODIUM 70 MG PO TABS: 70.0000 mg | ORAL_TABLET | ORAL | 0 refills | Status: DC

## 2021-12-30 NOTE — Assessment & Plan Note (Addendum)
#   Breast ca er/pr- pos; her 2 neu neg; high risk oncotype s/p TC [2012] s/p arimidex [finished may 2016]. Mammogram JULY 2022- NEG. Clinically no NED. STABLE.   # Bone density OSTEOPOROSIS [2020]- T score= -3.9; JUNE 2023-bone density-T score of -4.1 worsening.  Patient currently not on calcium vitamin D.  Discussed recommendations for vitamin D plus calcium. Discussed the benefits of bisphosphonate therapy -Fosamax/reclast in improving /treating osteoporosis. Discussed the potential side effects including but not limited to hypocalcemia osteonecrosis of the jaw.  Discussed regarding avoiding dental extraction around the time of reclast.  After lengthy discussion with the patient and son finally in agreement to proceed with Fosamax.  Prescription filled.  Again discussed the risk of gastritis; and the need to be upright for at least 3 hours post intake on empty stomach/with water.  # CKD-III- GFR 41-STABLE;  monitor for now- STABLE.   #DISPOSITION: # follow up in 1 year MD; labs- cbc/cmp/bil mammogram-Dr.B

## 2021-12-30 NOTE — Progress Notes (Unsigned)
[place] CONSULT NOTE  Patient Care Team: Sofie Hartigan, MD as PCP - General (Family Medicine) Lequita Asal, MD (Inactive) as Referring Physician (Hematology and Oncology) Cammie Sickle, MD as Consulting Physician (Oncology)  CHIEF COMPLAINTS/PURPOSE OF CONSULTATION: Breast cancer  #  Oncology History Overview Note  # 2011-  Carcinoma of breast status post lumpectomyestrogen receptor positive, progesterone receptor positive.  Positive margin with ductal carcinoma in situ. [Dr.Smith] 2. Oncotype DX score is 28 with recurring score is high in between 14%-22% on average 18 %. 3. Cytoxan and Taxotere for 4 cycle 4. Followed by radiation therapy finished in December of 2011  5. Started on Femara.  December of 2011 6. Changed to Arimidex (insurance request) 7/finishing Arimidex  in May of 2016      Cancer of breast [facility])  Carcinoma of upper-outer quadrant of left breast in female, estrogen receptor positive ([place])  01/20/2016 Initial Diagnosis   Malignant neoplasm of upper-outer quadrant of left female breast ([place])     HISTORY OF PRESENTING ILLNESS: Accompanied by her son.  Ambulating independently.  Victoria Prince 88 y.o.  female with a history of stage I ER/PR positive breast cancer currently on surveillance is here for follow-up/review results of the bone density test mammogram.  Patient denies any new lumps or bumps.  Denies any nausea vomiting abdominal pain.  Denies any cough.  Chronic arthritic pain not any worse.   Review of Systems  Constitutional:  Negative for chills, diaphoresis, fever, malaise/fatigue and weight loss.  HENT:  Negative for nosebleeds and sore throat.   Eyes:  Negative for double vision.  Respiratory:  Negative for cough, hemoptysis, sputum production, shortness of breath and wheezing.   Cardiovascular:  Negative for chest pain, palpitations, orthopnea and leg swelling.  Gastrointestinal:  Negative for abdominal  pain, blood in stool, constipation, diarrhea, heartburn, melena, nausea and vomiting.  Genitourinary:  Negative for dysuria, frequency and urgency.  Musculoskeletal:  Positive for joint pain. Negative for back pain.  Skin: Negative.  Negative for itching and rash.  Neurological:  Negative for dizziness, tingling, focal weakness, weakness and headaches.  Endo/Heme/Allergies:  Does not bruise/bleed easily.  Psychiatric/Behavioral:  Negative for depression. The patient is not nervous/anxious and does not have insomnia.      MEDICAL HISTORY:  Past Medical History:  Diagnosis Date   Breast cancer ([place]) 2011   left breast lumpectomy with rad and chemo tx   Personal history of chemotherapy 2011   left breast   Personal history of radiation therapy 2011   left breast   Radiation 2011   S/P chemotherapy, time since greater than 12 weeks 2011    SURGICAL HISTORY: Past Surgical History:  Procedure Laterality Date   BREAST BIOPSY Left 2012   negative   BREAST BIOPSY Left 2011   2 areas removed. Retroaerola area benign.  UOQ was DCIS with positive margin per Dr. Rogue Bussing notes.    BREAST BIOPSY Right 12/26/2019   [place] bx, Q marker, path pending   SKIN LESION EXCISION  06/2021    SOCIAL HISTORY: Social History   Socioeconomic History   Marital status: Married    Spouse name: Not on file   Number of children: Not on file   Years of education: Not on file   Highest education level: Not on file  Occupational History   Not on file  Tobacco Use   Smoking status: Never   Smokeless tobacco: Never  Vaping Use   Vaping  Use: Never used  Substance and Sexual Activity   Alcohol use: Not on file   Drug use: Not on file   Sexual activity: Not on file  Other Topics Concern   Not on file  Social History Narrative   Not on file   Social Determinants of Health   Financial Resource Strain: Not on file  Food Insecurity: Not on file  Transportation Needs: Not on file  Physical Activity:  Not on file  Stress: Not on file  Social Connections: Not on file  Intimate Partner Violence: Not on file    FAMILY HISTORY: Family History  Problem Relation Age of Onset   Breast cancer Neg Hx     ALLERGIES:  is allergic to sulfa antibiotics.  MEDICATIONS:  Current Outpatient Medications  Medication Sig Dispense Refill   amitriptyline (ELAVIL) 25 MG tablet Take 25 mg by mouth at bedtime.      amLODipine (NORVASC) 2.5 MG tablet Take 2.5 mg by mouth daily.      cetirizine (ZYRTEC) 10 MG tablet Take 1 tablet by mouth daily.     levocetirizine (XYZAL) 5 MG tablet Take 10 mg by mouth every evening.     naproxen sodium (ALEVE) 220 MG tablet Take by mouth.     pravastatin (PRAVACHOL) 40 MG tablet Take 40 mg by mouth daily.      triamcinolone cream (KENALOG) 0.5 % Apply topically as directed.     alendronate (FOSAMAX) 70 MG tablet Take 1 tablet (70 mg total) by mouth once a week. Take with a full glass of water on an empty stomach; sit upright for 3 hours. 48 tablet 0   clobetasol cream (TEMOVATE) 2.56 % Apply 1 Application topically as needed.  (Patient not taking: Reported on 12/30/2021)     hydrocortisone (ANUSOL-HC) 25 MG suppository Place 25 mg rectally 2 (two) times daily. (Patient not taking: Reported on 12/30/2021)     nystatin (MYCOSTATIN/NYSTOP) powder Apply 1 application topically 3 (three) times daily. (Patient not taking: Reported on 12/30/2021) 15 g 1   No current facility-administered medications for this visit.      Marland Kitchen  PHYSICAL EXAMINATION: ECOG PERFORMANCE STATUS: 0 - Asymptomatic  Vitals:   12/30/21 1100  BP: (!) 159/76  Pulse: 65  Resp: 16  Temp: (!) 97.4 F (36.3 C)   Filed Weights   12/30/21 1100  Weight: 146 lb 3.2 oz (66.3 kg)    Physical Exam Vitals and nursing note reviewed.  Constitutional:      Comments: Ambulating: independently with a walker wheelchair  Alone with family  HENT:     Head: Normocephalic and atraumatic.     Mouth/Throat:      Pharynx: Oropharynx is clear.  Eyes:     Extraocular Movements: Extraocular movements intact.     Pupils: Pupils are equal, round, and reactive to light.  Cardiovascular:     Rate and Rhythm: Normal rate and regular rhythm.  Pulmonary:     Comments: Decreased breath sounds bilaterally.  Abdominal:     Palpations: Abdomen is soft.  Musculoskeletal:        General: Normal range of motion.     Cervical back: Normal range of motion.  Skin:    General: Skin is warm.  Neurological:     General: No focal deficit present.     Mental Status: She is alert and oriented to person, place, and time.  Psychiatric:        Behavior: Behavior normal.  Judgment: Judgment normal.     LABORATORY DATA:  I have reviewed the data as listed Lab Results  Component Value Date   WBC 4.6 12/30/2021   HGB 15.0 12/30/2021   HCT 46.1 (H) 12/30/2021   MCV 98.1 12/30/2021   PLT 151 12/30/2021   Recent Labs    12/30/21 0954  NA 138  K 4.1  CL 107  CO2 27  GLUCOSE 84  BUN 15  CREATININE 1.25*  CALCIUM 8.9  GFRNONAA 41*  PROT 6.7  ALBUMIN 4.0  AST 30  ALT 24  ALKPHOS 53  BILITOT 0.5    RADIOGRAPHIC STUDIES: I have personally reviewed the radiological images as listed and agreed with the findings in the report. MM 3D SCREEN BREAST BILATERAL  Result Date: 12/25/2021 CLINICAL DATA:  Screening. EXAM: DIGITAL SCREENING BILATERAL MAMMOGRAM WITH TOMOSYNTHESIS AND CAD TECHNIQUE: Bilateral screening digital craniocaudal and mediolateral oblique mammograms were obtained. Bilateral screening digital breast tomosynthesis was performed. The images were evaluated with computer-aided detection. COMPARISON:  Previous exam(s). ACR Breast Density Category b: There are scattered areas of fibroglandular density. FINDINGS: There are no findings suspicious for malignancy. LEFT breast surgical changes again noted. IMPRESSION: No mammographic evidence of malignancy. A result letter of this screening mammogram will  be mailed directly to the patient. RECOMMENDATION: Screening mammogram in one year. (Code:SM-B-01Y) BI-RADS CATEGORY  2: Benign. Electronically Signed   By: Margarette Canada M.D.   On: 12/25/2021 16:18   DG Bone Density  Result Date: 12/25/2021 EXAM: DUAL X-RAY ABSORPTIOMETRY (DXA) FOR BONE MINERAL DENSITY IMPRESSION: Your patient Victoria Prince completed a BMD test on 12/25/2021 using the [place] (software version: 14.10) manufactured by [employer]. The following summarizes the results of our evaluation. Technologist: MTB PATIENT BIOGRAPHICAL: Name: Victoria Prince, Victoria Prince Patient ID: [id number] Birth Date: April 13, 1929 Height: 61.5 in. Gender: Female Exam Date: 12/25/2021 Weight: 146.0 lbs. Indications: Caucasian, History of Osteoporosis, Postmenopausal, left hip replacement, Advanced Age, History of Breast Cancer, Previous Chemo and Radiation Fractures: Treatments: DENSITOMETRY RESULTS: Site         Region     Measured Date Measured Age WHO Classification Young Adult T-score BMD         %Change vs. Previous Significant Change (*) AP Spine L1-L4 (L3) 12/25/2023 91.1 Osteopenia -1.4 1.016 g/cm2 -1.1% - AP Spine L1-L4 (L3) 01/18/2019 88.2 Osteopenia -1.3 1.027 g/cm2 1.5% - AP Spine L1-L4 (L3) 10/26/2016 86.0 Osteopenia -1.4 1.012 g/cm2 -1.8% - AP Spine L1-L4 (L3) 10/23/2014 84.0 Osteopenia -1.2 1.031 g/cm2 -0.2% - AP Spine L1-L4 (L3) 10/17/2013 82.9 Osteopenia -1.2 1.033 g/cm2 4.6% Yes AP Spine L1-L4 (L3) 03/27/2008 77.4 Osteopenia -1.6 0.988 g/cm2 - - Right Femur Neck 12/25/2021 91.1 Osteoporosis -2.5 0.690 g/cm2 -2.5% - Right Femur Neck 01/18/2019 88.2 Osteopenia -2.4 0.708 g/cm2 -2.5% - Right Femur Neck 10/26/2016 86.0 Osteopenia -2.2 0.726 g/cm2 -6.0% - Right Femur Neck 10/23/2014 84.0 Osteopenia -1.9 0.772 g/cm2 5.2% - Right Femur Neck 10/17/2013 82.9 Osteopenia -2.2 0.734 g/cm2 0.5% - Right Femur Neck 03/27/2008 77.4 Osteopenia -2.2 0.730 g/cm2 - - Right Femur Total 12/25/2021 91.1  Osteopenia -2.2 0.734 g/cm2 -3.3% Yes Right Femur Total 01/18/2019 88.2 Osteopenia -2.0 0.759 g/cm2 -4.9% Yes Right Femur Total 10/26/2016 86.0 Osteopenia -1.7 0.798 g/cm2 -2.6% Yes Right Femur Total 10/23/2014 84.0 Osteopenia -1.5 0.819 g/cm2 1.6% - Right Femur Total 10/17/2013 82.9 Osteopenia -1.6 0.806 g/cm2 -0.6% - Right Femur Total 03/27/2008 77.4 Osteopenia -1.6 0.811 g/cm2 - - Left Forearm Radius 33% 12/25/2021 91.1 Osteoporosis -4.1  0.521 g/cm2 -2.3% - Left Forearm Radius 33% 01/18/2019 88.2 Osteoporosis -3.9 0.533 g/cm2 -6.3% Yes Left Forearm Radius 33% 10/26/2016 86.0 Osteoporosis -3.5 0.569 g/cm2 -3.2% - Left Forearm Radius 33% 10/23/2014 84.0 Osteoporosis -3.3 0.588 g/cm2 -3.8% - Left Forearm Radius 33% 03/27/2008 77.4 Osteoporosis -3.0 0.611 g/cm2 - - ASSESSMENT: The BMD measured at Forearm Radius 33% is 0.521 g/cm2 with a T-score of -4.1. This patient is considered osteoporotic according to [facility]) criteria. The scan quality is good. L-3 was excluded due to degenerative changes. Compared with prior study, there has been no significant change in the spine. Compared with prior study, there has been significant decrease in the total hip. \ World Health Organization [facility]) criteria for post-menopausal, Caucasian Women: Normal:                   T-score at or above -1 SD Osteopenia/low bone mass: T-score between -1 and -2.5 SD Osteoporosis:             T-score at or below -2.5 SD RECOMMENDATIONS: 1. All patients should optimize calcium and vitamin D intake. 2. Consider FDA-approved medical therapies in postmenopausal women and men aged 39 years and older, based on the following: a. A hip or vertebral(clinical or morphometric) fracture b. T-score < -2.5 at the femoral neck or spine after appropriate evaluation to exclude secondary causes c. Low bone mass (T-score between -1.0 and -2.5 at the femoral neck or spine) and a 10-year probability of a hip fracture > 3% or a 10-year  probability of a major osteoporosis-related fracture > 20% based on the [place]-adapted WHO algorithm 3. Clinician judgment and/or patient preferences may indicate treatment for people with 10-year fracture probabilities above or below these levels FOLLOW-UP: People with diagnosed cases of osteoporosis or at high risk for fracture should have regular bone mineral density tests. For patients eligible for Medicare, routine testing is allowed once every 2 years. The testing frequency can be increased to one year for patients who have rapidly progressing disease, those who are receiving or discontinuing medical therapy to restore bone mass, or have additional risk factors. I have reviewed this report, and agree with the above findings. [facility] Electronically Signed   By: Zerita Boers M.D.   On: 12/25/2021 10:35    ASSESSMENT & PLAN:   Carcinoma of upper-outer quadrant of left breast in female, estrogen receptor positive ([place]) # Breast ca er/pr- pos; her 2 neu neg; high risk oncotype s/p TC [2012] s/p arimidex [finished may 2016]. Mammogram JULY 2022- NEG. Clinically no NED. STABLE.   # Bone density OSTEOPOROSIS [2020]- T score= -3.9; JUNE 2023-bone density-T score of -4.1 worsening.  Patient currently not on calcium vitamin D.  Discussed recommendations for vitamin D plus calcium. Discussed the benefits of bisphosphonate therapy -Fosamax/reclast in improving /treating osteoporosis. Discussed the potential side effects including but not limited to hypocalcemia osteonecrosis of the jaw.  Discussed regarding avoiding dental extraction around the time of reclast.  After lengthy discussion with the patient and son finally in agreement to proceed with Fosamax.  Prescription filled.  Again discussed the risk of gastritis; and the need to be upright for at least 3 hours post intake on empty stomach/with water.  # CKD-III- GFR 41-STABLE;  monitor for now- STABLE.   #DISPOSITION: # follow up in 1 year  MD; labs- cbc/cmp/bil mammogram-Dr.B     All questions were answered. The patient knows to call the clinic with any problems, questions or concerns.  Cammie Sickle, MD 12/31/2021 8:16 PM

## 2021-12-30 NOTE — Progress Notes (Signed)
Patient denies new problems/concerns today.    Patient had suspicious skin lesion excision with dermatology in 06/2021.

## 2022-02-02 ENCOUNTER — Emergency Department: Payer: Medicare HMO

## 2022-02-02 ENCOUNTER — Ambulatory Visit (INDEPENDENT_AMBULATORY_CARE_PROVIDER_SITE_OTHER): Payer: Medicare HMO

## 2022-02-02 ENCOUNTER — Encounter: Payer: Self-pay | Admitting: Emergency Medicine

## 2022-02-02 ENCOUNTER — Other Ambulatory Visit: Payer: Self-pay

## 2022-02-02 ENCOUNTER — Ambulatory Visit (INDEPENDENT_AMBULATORY_CARE_PROVIDER_SITE_OTHER)
Admission: EM | Admit: 2022-02-02 | Discharge: 2022-02-02 | Disposition: A | Discharge status: Home / Self Care | Payer: Medicare HMO | Source: Home / Self Care

## 2022-02-02 ENCOUNTER — Emergency Department
Admission: EM | Admit: 2022-02-02 | Discharge: 2022-02-02 | Disposition: A | Discharge status: Home / Self Care | Payer: Medicare HMO | Attending: Emergency Medicine | Admitting: Emergency Medicine

## 2022-02-02 DIAGNOSIS — J168 Pneumonia due to other specified infectious organisms: Secondary | ICD-10-CM | POA: Insufficient documentation

## 2022-02-02 DIAGNOSIS — R059 Cough, unspecified: Secondary | ICD-10-CM | POA: Diagnosis present

## 2022-02-02 DIAGNOSIS — R0902 Hypoxemia: Secondary | ICD-10-CM | POA: Insufficient documentation

## 2022-02-02 DIAGNOSIS — Z20822 Contact with and (suspected) exposure to covid-19: Secondary | ICD-10-CM | POA: Insufficient documentation

## 2022-02-02 DIAGNOSIS — Z853 Personal history of malignant neoplasm of breast: Secondary | ICD-10-CM | POA: Insufficient documentation

## 2022-02-02 DIAGNOSIS — J189 Pneumonia, unspecified organism: Secondary | ICD-10-CM

## 2022-02-02 DIAGNOSIS — R509 Fever, unspecified: Secondary | ICD-10-CM

## 2022-02-02 LAB — BASIC METABOLIC PANEL
Anion gap: 9 (ref 5–15)
BUN: 16 mg/dL (ref 8–23)
CO2: 24 mmol/L (ref 22–32)
Calcium: 9.2 mg/dL (ref 8.9–10.3)
Chloride: 103 mmol/L (ref 98–111)
Creatinine, Ser: 1.51 mg/dL — ABNORMAL HIGH (ref 0.44–1.00)
GFR, Estimated: 32 mL/min — ABNORMAL LOW (ref >60)
Glucose, Bld: 130 mg/dL — ABNORMAL HIGH (ref 70–99)
Potassium: 3.7 mmol/L (ref 3.5–5.1)
Sodium: 136 mmol/L (ref 135–145)

## 2022-02-02 LAB — CBC
HCT: 45.9 % (ref 36.0–46.0)
Hemoglobin: 14.7 g/dL (ref 12.0–15.0)
MCH: 31.4 pg (ref 26.0–34.0)
MCHC: 32 g/dL (ref 30.0–36.0)
MCV: 98.1 fL (ref 80.0–100.0)
Platelets: 124 10*3/uL — ABNORMAL LOW (ref 150–400)
RBC: 4.68 MIL/uL (ref 3.87–5.11)
RDW: 13.6 % (ref 11.5–15.5)
WBC: 7.4 10*3/uL (ref 4.0–10.5)
nRBC: 0 % (ref 0.0–0.2)

## 2022-02-02 LAB — RESP PANEL BY RT-PCR (FLU A&B, COVID) ARPGX2
Influenza A by PCR: NEGATIVE
Influenza B by PCR: NEGATIVE
SARS Coronavirus 2 by RT PCR: NEGATIVE

## 2022-02-02 LAB — TROPONIN I (HIGH SENSITIVITY): Troponin I (High Sensitivity): 15 ng/L (ref <18)

## 2022-02-02 LAB — SARS CORONAVIRUS 2 BY RT PCR: SARS Coronavirus 2 by RT PCR: NEGATIVE

## 2022-02-02 MED ORDER — AZITHROMYCIN 250 MG PO TABS: ORAL_TABLET | ORAL | 0 refills | Status: DC

## 2022-02-02 MED ORDER — IOHEXOL 350 MG/ML SOLN
60.0000 mL | Freq: Once | INTRAVENOUS | Status: AC | PRN
  Administered 2022-02-02: 60 mL via INTRAVENOUS

## 2022-02-02 MED ORDER — AZITHROMYCIN 500 MG PO TABS
500.0000 mg | ORAL_TABLET | Freq: Once | ORAL | Status: AC
  Administered 2022-02-02: 500 mg via ORAL
  Filled 2022-02-02: qty 1

## 2022-02-02 MED ORDER — BENZONATATE 200 MG PO CAPS: 200.0000 mg | ORAL_CAPSULE | Freq: Three times a day (TID) | ORAL | 0 refills | Status: DC

## 2022-02-02 MED ORDER — PREDNISONE 20 MG PO TABS: 40.0000 mg | ORAL_TABLET | Freq: Every day | ORAL | 0 refills | Status: AC

## 2022-02-02 MED ORDER — PREDNISONE 20 MG PO TABS
60.0000 mg | ORAL_TABLET | Freq: Once | ORAL | Status: AC
  Administered 2022-02-02: 60 mg via ORAL
  Filled 2022-02-02: qty 3

## 2022-02-02 MED ORDER — AMOXICILLIN-POT CLAVULANATE 875-125 MG PO TABS
1.0000 | ORAL_TABLET | Freq: Once | ORAL | Status: AC
  Administered 2022-02-02: 1 via ORAL
  Filled 2022-02-02: qty 1

## 2022-02-02 MED ORDER — AMOXICILLIN-POT CLAVULANATE 875-125 MG PO TABS: 1.0000 | ORAL_TABLET | Freq: Two times a day (BID) | ORAL | 0 refills | Status: AC

## 2022-02-02 MED ORDER — IPRATROPIUM-ALBUTEROL 0.5-2.5 (3) MG/3ML IN SOLN
3.0000 mL | Freq: Once | RESPIRATORY_TRACT | Status: AC
  Administered 2022-02-02: 3 mL via RESPIRATORY_TRACT

## 2022-02-02 NOTE — ED Provider Notes (Signed)
[facility]    CSN: [id number] Arrival date & time: 02/02/22  1057      History   Chief Complaint Chief Complaint  Patient presents with   Cough    HPI Victoria Prince is a 88 y.o. female who presents with her son due to hx of cough x 3 days. Her R ear started hurting today since coughing so much. Has felt she has a fever since she has been chilling, but she never took her temp. Her cough is non productive. Has been having cough attacks. Denies HA or body aches.       Patient Active Problem List   Diagnosis Date Noted   Candida infection 12/13/2019   Age-related osteoporosis without current pathological fracture 01/15/2019   Carcinoma of upper-outer quadrant of left breast in female, estrogen receptor positive ([place]) 01/20/2016   Cancer of breast ([place]) 01/12/2015   H/O total hip arthroplasty 11/11/2014   Neuritis or radiculitis due to rupture of lumbar intervertebral disc 01/10/2014   DDD (degenerative disc disease), lumbar 01/10/2014   Lumbar canal stenosis 01/10/2014    Past Surgical History:  Procedure Laterality Date   BREAST BIOPSY Left 2012   negative   BREAST BIOPSY Left 2011   2 areas removed. Retroaerola area benign.  UOQ was DCIS with positive margin per Dr. Rogue Bussing notes.    BREAST BIOPSY Right 12/26/2019   [place] bx, Q marker, path pending   SKIN LESION EXCISION  06/2021    OB History   No obstetric history on file.      Home Medications    Prior to Admission medications   Medication Sig Start Date End Date Taking? Authorizing Provider  alendronate (FOSAMAX) 70 MG tablet Take 1 tablet (70 mg total) by mouth once a week. Take with a full glass of water on an empty stomach; sit upright for 3 hours. 12/30/21   Cammie Sickle, MD  amitriptyline (ELAVIL) 25 MG tablet Take 25 mg by mouth at bedtime.     [provider]  amLODipine (NORVASC) 2.5 MG tablet Take 2.5 mg by mouth daily.     [provider]  cetirizine  (ZYRTEC) 10 MG tablet Take 1 tablet by mouth daily. 11/25/18   [provider]  clobetasol cream (TEMOVATE) 3.22 % Apply 1 Application topically as needed.  Patient not taking: Reported on 12/30/2021    [provider]  levocetirizine (XYZAL) 5 MG tablet Take 10 mg by mouth every evening.    [provider]  naproxen sodium (ALEVE) 220 MG tablet Take by mouth.    [provider]  pravastatin (PRAVACHOL) 40 MG tablet Take 40 mg by mouth daily.     [provider]  triamcinolone cream (KENALOG) 0.5 % Apply topically as directed. 12/11/20   [provider]    Family History Family History  Problem Relation Age of Onset   Breast cancer Neg Hx     Social History Social History   Tobacco Use   Smoking status: Never   Smokeless tobacco: Never  Vaping Use   Vaping Use: Never used     Allergies   Sulfa antibiotics   Review of Systems Review of Systems  Constitutional:  Positive for activity change, appetite change, chills, fatigue and fever. Negative for diaphoresis.  HENT:  Positive for postnasal drip. Negative for congestion, sore throat and trouble swallowing.   Respiratory:  Positive for cough. Negative for chest tightness and shortness of breath.   Cardiovascular:  Negative for chest pain.  Skin:  Negative for rash.  Neurological:  Negative for weakness and headaches.     Physical Exam Triage Vital Signs ED Triage Vitals  Enc Vitals Group     BP 02/02/22 1104 121/67     Pulse Rate 02/02/22 1104 94     Resp 02/02/22 1104 18     Temp 02/02/22 1104 (!) 100.8 F (38.2 C)     Temp Source 02/02/22 1104 Oral     SpO2 02/02/22 1104 93 %     Weight --      Height --      Head Circumference --      Peak Flow --      Pain Score 02/02/22 1103 0     Pain Loc --      Pain Edu? --      Excl. in [place]? --    No data found.  Updated Vital Signs BP 121/67 (BP Location: Left Arm)   Pulse 94   Temp (!) 100.8 F (38.2 C) (Oral)    Resp 18   SpO2 93%   Visual Acuity Right Eye Distance:   Left Eye Distance:   Bilateral Distance:    Right Eye Near:   Left Eye Near:    Bilateral Near:     Physical Exam Vitals and nursing note reviewed.  Constitutional:      General: She is not in acute distress.    Appearance: She is not ill-appearing.  HENT:     Right Ear: External ear normal. There is impacted cerumen.     Left Ear: External ear normal. There is impacted cerumen.     Ears:     Comments: After lavage both TM's are clear    Mouth/Throat:     Mouth: Mucous membranes are moist.  Eyes:     General: No scleral icterus.    Conjunctiva/sclera: Conjunctivae normal.  Cardiovascular:     Rate and Rhythm: Normal rate and regular rhythm.     Pulses: Normal pulses.  Pulmonary:     Breath sounds: Rales present.     Comments: Taking and deep breaths provoked her cough attacks. After the neb treatment the cough attacks improved,  and has mild crackles on RML and RLL Musculoskeletal:        General: Normal range of motion.     Cervical back: Neck supple.     Right lower leg: No edema.     Left lower leg: No edema.  Skin:    General: Skin is warm and dry.  Neurological:     Mental Status: She is alert and oriented to person, place, and time.     Gait: Gait normal.  Psychiatric:        Mood and Affect: Mood normal.        Behavior: Behavior normal.        Thought Content: Thought content normal.        Judgment: Judgment normal.      UC Treatments / Results  Labs (all labs ordered are listed, but only abnormal results are displayed) Labs Reviewed  RESP PANEL BY RT-PCR (FLU A&B, COVID) [id number]  Flu A&B an Covid test are negative  EKG   Radiology DG Chest 2 View  Result Date: 02/02/2022 CLINICAL DATA:  Cough, fever. EXAM: CHEST - 2 VIEW COMPARISON:  May 04, 2014. FINDINGS: The heart size and mediastinal contours are within normal limits. Left lung is clear. Faint nodular opacity is noted  laterally in the right midlung. The visualized skeletal structures are unremarkable. IMPRESSION: Faint nodular opacity is noted laterally in right midlung. This may represent pneumonia, but neoplasm cannot be excluded. CT scan of the chest is recommended for further evaluation. Electronically Signed   By: Marijo Conception M.D.   On: 02/02/2022 12:00    Procedures Procedures (including critical care time)  Medications Ordered in UC Medications  ipratropium-albuterol (DUONEB) 0.5-2.5 (3) MG/3ML nebulizer solution 3 mL (3 mLs Nebulization Given 02/02/22 1154)    Initial Impression / Assessment and Plan / UC Course  I have reviewed the triage vital signs and the nursing notes.  Pertinent labs & imaging results that were available during my care of the patient were reviewed by me and considered in my medical decision making (see chart for details).  She was given a Duo neb and we did ear lavage After duo neb her O2 stayed between 88-91%, even after I had her cough it did not improve.  Has R middle lung opacity which may be pneumonia I spoke with her son about needing to take her to the hospital and he is in agreement with this.    Final Clinical Impressions(s) / UC Diagnoses   Final diagnoses:  Pneumonia of right middle lobe due to infectious organism  Hypoxemia     Discharge Instructions      Your chest xray shows a spot in the right middle lung which could be a pneumonia or something else. The radiologist recommends you have a CT of your chest. Please go to the ER today since your oxygen is staying so low, you will need to be placed on oxygen      ED Prescriptions   None    PDMP not reviewed this encounter.   Shelby Mattocks, Hershal Coria 02/02/22 1237

## 2022-02-02 NOTE — ED Triage Notes (Signed)
Pt presents with a cough, right ear pain x 3 days.

## 2022-02-02 NOTE — Discharge Instructions (Addendum)
Your chest xray shows a spot in the right middle lung which could be a pneumonia or something else. The radiologist recommends you have a CT of your chest. Please go to the ER today since your oxygen is staying so low, you will need to be placed on oxygen

## 2022-02-02 NOTE — ED Triage Notes (Signed)
Pt in with co cough for few days, went to urgent care and was told to come here due to low o2 sats. PT in no distress in triage, pt states she has had a fever at home.

## 2022-02-02 NOTE — ED Provider Triage Note (Signed)
Emergency Medicine Provider Triage Evaluation Note  Victoria Prince , a 88 y.o. female  was evaluated in triage.  Pt complains of cough, shortness of breath with cough, low O2 saturation at urgent care.  Review of Systems  Positive: Cough, shortness of breath with cough Negative: Chest pain  Physical Exam  BP (!) 152/101 (BP Location: Left Arm)   Pulse 92   Temp 99.3 F (37.4 C) (Oral)   Resp 20   Ht '5\' 2"'$  (1.575 m)   Wt 66.2 kg   SpO2 97%   BMI 26.70 kg/m  Gen:   Awake, no distress   Resp:  Normal effort  MSK:   Moves extremities without difficulty  Other:    Medical Decision Making  Medically screening exam initiated at 2:50 PM.  Appropriate orders placed.  Diego Cory was informed that the remainder of the evaluation will be completed by another provider, this initial triage assessment does not replace that evaluation, and the importance of remaining in the ED until their evaluation is complete.     Versie Starks, PA-C 02/02/22 1450

## 2022-02-02 NOTE — Discharge Instructions (Signed)
Please take antibiotics as prescribed.  Use cough medication as needed for cough.  If any worsening cough, shortness of breath, chest pain or fevers above 101 that or not going down with Tylenol please return to the ER.

## 2022-02-02 NOTE — ED Notes (Signed)
See triage note  Presents with low grade temp  and cough    States UC instructed her to come to ED d/t her O2 sat being low

## 2022-02-02 NOTE — ED Provider Notes (Signed)
[facility] Provider Note   CSN: [id number] Arrival date & time: 02/02/22  1410     History  Chief Complaint  Patient presents with   Cough    Victoria Prince is a 88 y.o. female.  With history of breast cancer presents to the emergency department for evaluation of cough.  She had a cough for 10 days.  She has had a little bit of a low-grade fever today.  Patient's son with similar symptoms earlier in the month.  Patient's cough is dry, nonproductive.  No chest pain shortness of breath vomiting or diarrhea.  No rashes.  She denies any wheezing.  X-ray taken at urgent care today showed a density in the right upper lobe, radiologist recommended CT scan for further evaluation  HPI     Home Medications Prior to Admission medications   Medication Sig Start Date End Date Taking? Authorizing Provider  amoxicillin-clavulanate (AUGMENTIN) 875-125 MG tablet Take 1 tablet by mouth 2 (two) times daily for 10 days. 02/02/22 02/12/22 Yes Duanne Guess, PA-C  azithromycin (ZITHROMAX Z-PAK) 250 MG tablet Take 2 tablets (500 mg) on  Day 1,  followed by 1 tablet (250 mg) once daily on Days 2 through 5. 02/02/22  Yes Duanne Guess, PA-C  benzonatate (TESSALON) 200 MG capsule Take 1 capsule (200 mg total) by mouth 3 (three) times daily. 02/02/22  Yes Duanne Guess, PA-C  predniSONE (DELTASONE) 20 MG tablet Take 2 tablets (40 mg total) by mouth daily with breakfast for 5 days. 02/02/22 02/07/22 Yes Duanne Guess, PA-C  alendronate (FOSAMAX) 70 MG tablet Take 1 tablet (70 mg total) by mouth once a week. Take with a full glass of water on an empty stomach; sit upright for 3 hours. 12/30/21   Cammie Sickle, MD  amitriptyline (ELAVIL) 25 MG tablet Take 25 mg by mouth at bedtime.     [provider]  amLODipine (NORVASC) 2.5 MG tablet Take 2.5 mg by mouth daily.     [provider]  cetirizine (ZYRTEC) 10 MG tablet Take 1 tablet by  mouth daily. 11/25/18   [provider]  clobetasol cream (TEMOVATE) 7.85 % Apply 1 Application topically as needed.  Patient not taking: Reported on 12/30/2021    [provider]  levocetirizine (XYZAL) 5 MG tablet Take 10 mg by mouth every evening.    [provider]  naproxen sodium (ALEVE) 220 MG tablet Take by mouth.    [provider]  pravastatin (PRAVACHOL) 40 MG tablet Take 40 mg by mouth daily.     [provider]  triamcinolone cream (KENALOG) 0.5 % Apply topically as directed. 12/11/20   [provider]      Allergies    Sulfa antibiotics    Review of Systems   Review of Systems  Physical Exam Updated Vital Signs BP (!) 148/99 (BP Location: Left Arm)   Pulse 90   Temp 98.9 F (37.2 C) (Oral)   Resp 18   Ht '5\' 2"'$  (1.575 m)   Wt 66.2 kg   SpO2 98%   BMI 26.70 kg/m  Physical Exam Constitutional:      Appearance: She is well-developed.  HENT:     Head: Normocephalic and atraumatic.  Eyes:     Conjunctiva/sclera: Conjunctivae normal.  Cardiovascular:     Rate and Rhythm: Normal rate.  Pulmonary:     Effort: Pulmonary effort is normal. No respiratory distress.     Breath  sounds: Normal breath sounds. No stridor. No wheezing, rhonchi or rales.  Abdominal:     General: There is no distension.     Tenderness: There is no abdominal tenderness. There is no guarding.  Musculoskeletal:        General: Normal range of motion.     Cervical back: Normal range of motion.  Skin:    General: Skin is warm.     Findings: No rash.  Neurological:     General: No focal deficit present.     Mental Status: She is alert and oriented to person, place, and time.  Psychiatric:        Behavior: Behavior normal.        Thought Content: Thought content normal.     ED Results / Procedures / Treatments   Labs (all labs ordered are listed, but only abnormal results are displayed) Labs Reviewed  CBC - Abnormal; Notable for the  following components:      Result Value   Platelets 124 (*)    All other components within normal limits  BASIC METABOLIC PANEL - Abnormal; Notable for the following components:   Glucose, Bld 130 (*)    Creatinine, Ser 1.51 (*)    GFR, Estimated 32 (*)    All other components within normal limits  SARS CORONAVIRUS 2 BY RT PCR  TROPONIN I (HIGH SENSITIVITY)  TROPONIN I (HIGH SENSITIVITY)    EKG None  Radiology CT Angio Chest PE W and/or Wo Contrast  Result Date: 02/02/2022 CLINICAL DATA:  88 year old female with cough, rule out PE EXAM: CT ANGIOGRAPHY CHEST WITH CONTRAST TECHNIQUE: Multidetector CT imaging of the chest was performed using the standard protocol during bolus administration of intravenous contrast. Multiplanar CT image reconstructions and MIPs were obtained to evaluate the vascular anatomy. RADIATION DOSE REDUCTION: This exam was performed according to the departmental dose-optimization program which includes automated exposure control, adjustment of the mA and/or kV according to patient size and/or use of iterative reconstruction technique. CONTRAST:  40m OMNIPAQUE IOHEXOL 350 MG/ML SOLN COMPARISON:  CT chest without contrast earlier today FINDINGS: Cardiovascular: Satisfactory opacification of the pulmonary arteries to the segmental level. No evidence of pulmonary embolism. Normal heart size. No pericardial effusion. Coronary artery and aortic atherosclerotic calcification. Mediastinum/Nodes: No thoracic adenopathy by size. Unremarkable thyroid and esophagus. Trachea is patent. Lungs/Pleura: Diffuse bronchial wall thickening greatest in the lower lobes. Peribronchovascular consolidation and volume loss in the right lower lobe. Scattered areas of scarring. Redemonstrated geographic airspace opacity in the inferior right upper lobe laterally. Upper Abdomen: No acute abnormality. Musculoskeletal: No acute osseous abnormality. Review of the MIP images confirms the above findings.  IMPRESSION: 1. Negative for acute pulmonary embolism. 2. Peripheral right upper lobe airspace opacity consistent with pneumonia. 3. Right lower lobe airspace disease indeterminate for pneumonia versus atelectasis/scar. 4. Coronary artery atherosclerosis. Aortic Atherosclerosis (ICD10-I70.0). Electronically Signed   By: TPlacido SouM.D.   On: 02/02/2022 19:01   CT CHEST WO CONTRAST  Result Date: 02/02/2022 CLINICAL DATA:  Cough.  Symptoms for 3 days.  Fever. EXAM: CT CHEST WITHOUT CONTRAST TECHNIQUE: Multidetector CT imaging of the chest was performed following the standard protocol without IV contrast. RADIATION DOSE REDUCTION: This exam was performed according to the departmental dose-optimization program which includes automated exposure control, adjustment of the mA and/or kV according to patient size and/or use of iterative reconstruction technique. COMPARISON:  Chest radiograph of earlier today FINDINGS: Cardiovascular: Aortic atherosclerosis. Tortuous thoracic aorta. Mild cardiomegaly. Three vessel coronary artery  calcification. Mediastinum/Nodes: No mediastinal or definite hilar adenopathy, given limitations of unenhanced CT. Lungs/Pleura: Trace pleural fluid is favored to be physiologic. Lingular and right middle lobe scarring are mild. Dependent medial right lower lobe airspace disease is relatively mild on 94/3. Corresponding to the plain film abnormality, within the peripheral inferior right upper lobe is small volume airspace and ground-glass opacity including on 53/3 and sagittal image 27. Upper Abdomen: Normal imaged portions of the liver, spleen, stomach, pancreas, gallbladder, adrenal glands, kidneys. Musculoskeletal: Midthoracic spondylosis. IMPRESSION: 1. Peripheral inferior right upper lobe airspace and ground-glass opacity, favoring a focus of infection. Given peripheral location, pulmonary infarct could look similar. As this is plain film visible, recommend radiographic follow-up until  resolution. 2. Right lower lobe airspace disease is also suspicious for a focus of infection versus less likely atelectasis/scar. 3. Coronary artery atherosclerosis. Aortic Atherosclerosis (ICD10-I70.0). Electronically Signed   By: Abigail Miyamoto M.D.   On: 02/02/2022 16:34   DG Chest 2 View  Result Date: 02/02/2022 CLINICAL DATA:  Cough. EXAM: CHEST - 2 VIEW COMPARISON:  Chest x-ray 02/02/2022 and 05/04/2014. FINDINGS: Faint nodular density in the lateral right mid lung is unchanged. No new focal lung infiltrate, pleural effusion or pneumothorax. The cardiomediastinal silhouette is stable, the heart is enlarged. No acute fractures are seen. IMPRESSION: 1. Unchanged faint nodular density in the right mid lung. Findings may be infectious or neoplastic. Recommend follow-up chest CT for further evaluation. Electronically Signed   By: Ronney Asters M.D.   On: 02/02/2022 15:45   DG Chest 2 View  Result Date: 02/02/2022 CLINICAL DATA:  Cough, fever. EXAM: CHEST - 2 VIEW COMPARISON:  May 04, 2014. FINDINGS: The heart size and mediastinal contours are within normal limits. Left lung is clear. Faint nodular opacity is noted laterally in the right midlung. The visualized skeletal structures are unremarkable. IMPRESSION: Faint nodular opacity is noted laterally in right midlung. This may represent pneumonia, but neoplasm cannot be excluded. CT scan of the chest is recommended for further evaluation. Electronically Signed   By: Marijo Conception M.D.   On: 02/02/2022 12:00    Procedures Procedures    Medications Ordered in ED Medications  amoxicillin-clavulanate (AUGMENTIN) 875-125 MG per tablet 1 tablet (has no administration in time range)  predniSONE (DELTASONE) tablet 60 mg (has no administration in time range)  azithromycin (ZITHROMAX) tablet 500 mg (has no administration in time range)  iohexol (OMNIPAQUE) 350 MG/ML injection 60 mL (60 mLs Intravenous Contrast Given 02/02/22 1832)    ED Course/  Medical Decision Making/ A&P                           Medical Decision Making Amount and/or Complexity of Data Reviewed Labs: ordered. Radiology: ordered.  Risk Prescription drug management.   88 year old female with 10 days of cough.  Chest x-ray showed opacity in the right middle lobe,, CT recommended.  CT scan showed no PE or mass but infiltrate consistent with right upper lobe pneumonia.  Patient with normal blood work, troponin and EKG.  CT scan reviewed by me consistent with right upper lobe pneumonia.  Will place patient on Augmentin and Z-Pak along with prednisone and Tessalon Perles.  She understands signs symptoms return to the ER for.   Final Clinical Impression(s) / ED Diagnoses Final diagnoses:  Pneumonia of right upper lobe due to infectious organism    Rx / DC Orders ED Discharge Orders  Ordered    amoxicillin-clavulanate (AUGMENTIN) 875-125 MG tablet  2 times daily        02/02/22 1928    azithromycin (ZITHROMAX Z-PAK) 250 MG tablet        02/02/22 1928    predniSONE (DELTASONE) 20 MG tablet  Daily with breakfast        02/02/22 1928    benzonatate (TESSALON) 200 MG capsule  3 times daily        02/02/22 1928              Renata Caprice 02/02/22 1930    Blake Divine, MD 02/02/22 1942

## 2022-03-08 ENCOUNTER — Encounter: Payer: Self-pay | Admitting: Radiology

## 2022-03-08 ENCOUNTER — Emergency Department: Payer: Medicare HMO

## 2022-03-08 ENCOUNTER — Inpatient Hospital Stay
Admission: EM | Admit: 2022-03-08 | Discharge: 2022-03-12 | DRG: 552 | Disposition: A | Discharge status: Skilled Nursing Facility | Payer: Medicare HMO | Attending: Internal Medicine | Admitting: Internal Medicine

## 2022-03-08 ENCOUNTER — Other Ambulatory Visit: Payer: Self-pay

## 2022-03-08 DIAGNOSIS — G479 Sleep disorder, unspecified: Secondary | ICD-10-CM | POA: Diagnosis present

## 2022-03-08 DIAGNOSIS — M48062 Spinal stenosis, lumbar region with neurogenic claudication: Secondary | ICD-10-CM | POA: Diagnosis not present

## 2022-03-08 DIAGNOSIS — Z9221 Personal history of antineoplastic chemotherapy: Secondary | ICD-10-CM

## 2022-03-08 DIAGNOSIS — Z79899 Other long term (current) drug therapy: Secondary | ICD-10-CM

## 2022-03-08 DIAGNOSIS — M5416 Radiculopathy, lumbar region: Secondary | ICD-10-CM | POA: Diagnosis present

## 2022-03-08 DIAGNOSIS — M549 Dorsalgia, unspecified: Principal | ICD-10-CM | POA: Diagnosis present

## 2022-03-08 DIAGNOSIS — Z7983 Long term (current) use of bisphosphonates: Secondary | ICD-10-CM

## 2022-03-08 DIAGNOSIS — Z882 Allergy status to sulfonamides status: Secondary | ICD-10-CM

## 2022-03-08 DIAGNOSIS — G8929 Other chronic pain: Secondary | ICD-10-CM | POA: Diagnosis present

## 2022-03-08 DIAGNOSIS — J449 Chronic obstructive pulmonary disease, unspecified: Secondary | ICD-10-CM | POA: Diagnosis not present

## 2022-03-08 DIAGNOSIS — R262 Difficulty in walking, not elsewhere classified: Secondary | ICD-10-CM | POA: Diagnosis present

## 2022-03-08 DIAGNOSIS — E785 Hyperlipidemia, unspecified: Secondary | ICD-10-CM | POA: Diagnosis present

## 2022-03-08 DIAGNOSIS — N1831 Chronic kidney disease, stage 3a: Secondary | ICD-10-CM | POA: Diagnosis present

## 2022-03-08 DIAGNOSIS — Z923 Personal history of irradiation: Secondary | ICD-10-CM

## 2022-03-08 DIAGNOSIS — I129 Hypertensive chronic kidney disease with stage 1 through stage 4 chronic kidney disease, or unspecified chronic kidney disease: Secondary | ICD-10-CM | POA: Diagnosis present

## 2022-03-08 DIAGNOSIS — Z853 Personal history of malignant neoplasm of breast: Secondary | ICD-10-CM

## 2022-03-08 DIAGNOSIS — M4800 Spinal stenosis, site unspecified: Secondary | ICD-10-CM | POA: Diagnosis present

## 2022-03-08 MED ORDER — OXYCODONE HCL 5 MG PO TABS
2.5000 mg | ORAL_TABLET | Freq: Once | ORAL | Status: AC
  Administered 2022-03-08: 2.5 mg via ORAL
  Filled 2022-03-08: qty 1

## 2022-03-08 NOTE — ED Provider Notes (Signed)
[facility] Provider Note    Event Date/Time   First MD Initiated Contact with Patient 03/08/22 2202     (approximate)   History   Leg Pain   HPI  Victoria Prince is a 88 y.o. female with history of degenerative disc disease and lumbar canal stenosis presents to the emergency department for treatment and evaluation of pain in her right lower back that is now radiating into the right hip and leg.  It started this morning when she got out of bed.  No specific injury.  She is able to bear weight however pain increases.  She denies any loss of bowel or bladder control.  She denies urinary frequency or dysuria.  She took 1 Aleve which seemed to give her some decrease in pain for a short period of time.  Past Medical History:  Diagnosis Date   Breast cancer ([place]) 2011   left breast lumpectomy with rad and chemo tx   Personal history of chemotherapy 2011   left breast   Personal history of radiation therapy 2011   left breast   Radiation 2011   S/P chemotherapy, time since greater than 12 weeks 2011     Physical Exam   Triage Vital Signs: ED Triage Vitals  Enc Vitals Group     BP 03/08/22 1946 (!) 159/71     Pulse Rate 03/08/22 1946 81     Resp 03/08/22 1946 15     Temp 03/08/22 1946 98.3 F (36.8 C)     Temp Source 03/08/22 1946 Oral     SpO2 03/08/22 1946 100 %     Weight 03/08/22 1947 146 lb (66.2 kg)     Height 03/08/22 1947 '5\' 2"'$  (1.575 m)     Head Circumference --      Peak Flow --      Pain Score 03/08/22 1947 10     Pain Loc --      Pain Edu? --      Excl. in [place]? --     Most recent vital signs: Vitals:   03/08/22 1946  BP: (!) 159/71  Pulse: 81  Resp: 15  Temp: 98.3 F (36.8 C)  SpO2: 100%    General: Awake, no distress.  CV:  Good peripheral perfusion.  Resp:  Normal effort.  Abd:  No distention.  Other:  No focal tenderness over the lumbar spine or hip.  Pain increases in the lower back and down the posterior aspect of  the right buttock with flexion of the right knee.  She is able to stand and bear weight.   ED Results / Procedures / Treatments   Labs (all labs ordered are listed, but only abnormal results are displayed) Labs Reviewed - No data to display   EKG  Not indicated   RADIOLOGY  Image of the right hip negative for acute concerns.  Image of the right knee shows advanced tricompartmental osteoarthritis without acute concerns.  I have independently reviewed and interpreted imaging as well as reviewed report from radiology.  PROCEDURES:  Critical Care performed: No  Procedures   MEDICATIONS ORDERED IN ED:  Medications  oxyCODONE (Oxy IR/ROXICODONE) immediate release tablet 2.5 mg (2.5 mg Oral Given 03/08/22 2230)     IMPRESSION / MDM / ASSESSMENT AND PLAN / ED COURSE   I reviewed the triage vital signs and the nursing notes.  Differential diagnosis includes, but is not limited to: Musculoskeletal hip pain, sciatica, hip fracture  Patient's presentation  is most consistent with acute illness / injury with system symptoms.  88 year old female presenting to the emergency department for evaluation of right hip/back pain that radiates into the right lower extremity.  See HPI for further details.  Patient denies recent injury.  No red flags of back pain with the exception of age.  She does have a history of degenerative disc disease and lumbar stenosis.  Exam is most consistent with sciatica.  To ensure there is no hip fracture or underlying cause, plan will be to get it CT of the right hip as well.  Patient is requesting pain medication of something other than Tylenol or Aleve.  2.5 mg oxy IR ordered.  12:45am Care relinquished to Dr. Starleen Blue who will follow up on CT and reassess pain and ability to bear weight.     FINAL CLINICAL IMPRESSION(S) / ED DIAGNOSES   Final diagnoses:  None  Clinical Impression: Sciatica   Rx / DC Orders   ED Discharge Orders     None         Note:  This document was prepared using Dragon voice recognition software and may include unintentional dictation errors.   Victorino Dike, FNP 03/12/22 1457    Blake Divine, MD 03/12/22 [phone]

## 2022-03-08 NOTE — ED Triage Notes (Signed)
Pt via [facility] form home c/o right leg and hip pain since getting out of bed this morning. Pt denies fall, no known injury. She says that by 3pm it hurt too much to tolerate weight bearing. Pt is a/o x 4 and is ambulatory unassisted at baseline, currently in a wheelchair due to pain.

## 2022-03-08 NOTE — ED Notes (Signed)
Per ems pt with right leg pain after ambulating this pm. Per ems pt with 9/10 now, does not want to weight bear, vitals stable per ems.

## 2022-03-08 NOTE — ED Notes (Signed)
Pt's son is upset regarding wait, states "who decides who has a real, so emergency doesn't really mean emergency". son insisting pt be taken to a hospital room for admission. Multiple times attempted to explain to pt's son triage and ed admission process, son does not understand process and continues to provide discourse with first rn. Son insists "she is an old lady and can't sit out her" again attempted to explain to son triage process and acuity process and treatment room wait without understanding from son.

## 2022-03-09 ENCOUNTER — Emergency Department: Payer: Medicare HMO

## 2022-03-09 ENCOUNTER — Encounter: Payer: Self-pay | Admitting: Radiology

## 2022-03-09 DIAGNOSIS — M549 Dorsalgia, unspecified: Secondary | ICD-10-CM | POA: Diagnosis not present

## 2022-03-09 DIAGNOSIS — R262 Difficulty in walking, not elsewhere classified: Secondary | ICD-10-CM | POA: Diagnosis present

## 2022-03-09 LAB — BASIC METABOLIC PANEL
Anion gap: 9 (ref 5–15)
BUN: 14 mg/dL (ref 8–23)
CO2: 22 mmol/L (ref 22–32)
Calcium: 9.6 mg/dL (ref 8.9–10.3)
Chloride: 108 mmol/L (ref 98–111)
Creatinine, Ser: 1.06 mg/dL — ABNORMAL HIGH (ref 0.44–1.00)
GFR, Estimated: 50 mL/min — ABNORMAL LOW (ref >60)
Glucose, Bld: 164 mg/dL — ABNORMAL HIGH (ref 70–99)
Potassium: 4.3 mmol/L (ref 3.5–5.1)
Sodium: 139 mmol/L (ref 135–145)

## 2022-03-09 LAB — CBC
HCT: 41.4 % (ref 36.0–46.0)
Hemoglobin: 13.4 g/dL (ref 12.0–15.0)
MCH: 31.2 pg (ref 26.0–34.0)
MCHC: 32.4 g/dL (ref 30.0–36.0)
MCV: 96.5 fL (ref 80.0–100.0)
Platelets: 193 10*3/uL (ref 150–400)
RBC: 4.29 MIL/uL (ref 3.87–5.11)
RDW: 13.5 % (ref 11.5–15.5)
WBC: 3.6 10*3/uL — ABNORMAL LOW (ref 4.0–10.5)
nRBC: 0 % (ref 0.0–0.2)

## 2022-03-09 MED ORDER — METHYLPREDNISOLONE SODIUM SUCC 125 MG IJ SOLR
60.0000 mg | Freq: Once | INTRAMUSCULAR | Status: AC
  Administered 2022-03-09: 60 mg via INTRAVENOUS
  Filled 2022-03-09: qty 2

## 2022-03-09 MED ORDER — AMITRIPTYLINE HCL 25 MG PO TABS
25.0000 mg | ORAL_TABLET | Freq: Every day | ORAL | Status: DC
  Administered 2022-03-09 – 2022-03-11 (×3): 25 mg via ORAL
  Filled 2022-03-09 (×3): qty 1

## 2022-03-09 MED ORDER — HYDROMORPHONE HCL 1 MG/ML IJ SOLN: 0.5000 mg | INTRAMUSCULAR | Status: DC | PRN

## 2022-03-09 MED ORDER — ORAL CARE MOUTH RINSE: 15.0000 mL | OROMUCOSAL | Status: DC | PRN

## 2022-03-09 MED ORDER — ENOXAPARIN SODIUM 40 MG/0.4ML IJ SOSY
40.0000 mg | PREFILLED_SYRINGE | INTRAMUSCULAR | Status: DC
  Administered 2022-03-09 – 2022-03-10 (×2): 40 mg via SUBCUTANEOUS
  Filled 2022-03-09 (×2): qty 0.4

## 2022-03-09 MED ORDER — MORPHINE SULFATE (PF) 2 MG/ML IV SOLN
2.0000 mg | INTRAVENOUS | Status: DC | PRN
  Administered 2022-03-09 (×2): 2 mg via INTRAVENOUS
  Filled 2022-03-09 (×2): qty 1

## 2022-03-09 MED ORDER — AMLODIPINE BESYLATE 5 MG PO TABS
2.5000 mg | ORAL_TABLET | Freq: Every day | ORAL | Status: DC
  Administered 2022-03-09 – 2022-03-12 (×4): 2.5 mg via ORAL
  Filled 2022-03-09 (×4): qty 1

## 2022-03-09 MED ORDER — NAPROXEN 250 MG PO TABS: 250.0000 mg | ORAL_TABLET | Freq: Every day | ORAL | Status: DC | PRN

## 2022-03-09 MED ORDER — MORPHINE SULFATE (PF) 2 MG/ML IV SOLN
2.0000 mg | Freq: Once | INTRAVENOUS | Status: AC
  Administered 2022-03-09: 2 mg via INTRAVENOUS
  Filled 2022-03-09: qty 1

## 2022-03-09 MED ORDER — LORATADINE 10 MG PO TABS
10.0000 mg | ORAL_TABLET | Freq: Every day | ORAL | Status: DC
  Administered 2022-03-09 – 2022-03-12 (×4): 10 mg via ORAL
  Filled 2022-03-09 (×4): qty 1

## 2022-03-09 MED ORDER — OXYCODONE HCL 5 MG PO TABS
5.0000 mg | ORAL_TABLET | ORAL | Status: DC | PRN
  Administered 2022-03-09 – 2022-03-11 (×6): 5 mg via ORAL
  Filled 2022-03-09 (×6): qty 1

## 2022-03-09 MED ORDER — BISACODYL 5 MG PO TBEC: 5.0000 mg | DELAYED_RELEASE_TABLET | Freq: Every day | ORAL | Status: DC | PRN

## 2022-03-09 MED ORDER — SENNOSIDES-DOCUSATE SODIUM 8.6-50 MG PO TABS: 1.0000 | ORAL_TABLET | Freq: Every evening | ORAL | Status: DC | PRN

## 2022-03-09 MED ORDER — DEXAMETHASONE 4 MG PO TABS
4.0000 mg | ORAL_TABLET | Freq: Two times a day (BID) | ORAL | Status: DC
  Administered 2022-03-09 – 2022-03-12 (×6): 4 mg via ORAL
  Filled 2022-03-09 (×6): qty 1

## 2022-03-09 MED ORDER — PRAVASTATIN SODIUM 20 MG PO TABS
40.0000 mg | ORAL_TABLET | Freq: Every evening | ORAL | Status: DC
  Administered 2022-03-09 – 2022-03-11 (×3): 40 mg via ORAL
  Filled 2022-03-09 (×3): qty 2

## 2022-03-09 MED ORDER — ONDANSETRON HCL 4 MG/2ML IJ SOLN
4.0000 mg | Freq: Four times a day (QID) | INTRAMUSCULAR | Status: DC | PRN
  Administered 2022-03-09: 4 mg via INTRAVENOUS
  Filled 2022-03-09: qty 2

## 2022-03-09 NOTE — ED Provider Notes (Addendum)
Patient signed out to me pending CT of the right hip.  88 year old female presenting with atraumatic right back/hip pain.  CT of right hip is negative for fracture.  There is some mention of possible hematoma which is not present on clinical exam.  On my evaluation patient is unable to bear weight.  She does not have any midline lumbar tenderness she has decreased hip strength with hip extension on the right but patient's exam is limited by pain.  She has normal strength with plantarflexion and dorsiflexion.  Patient unable to even take 1 step on the right hip and was quite unsteady.  She will require admission for physical therapy.  I think that the pain is primarily coming from the low back.  Will order MRI lumbar spine.  I have ordered 2 mg of morphine.  CT has no evidence of cauda equina syndrome.  There is severe spinal canal stenosis and lateral recess stenosis and L5-S1 radiculitis.  This would explain her symptoms.  I have given her 60 mg of Solu-Medrol.  We will admit to the hospitalist service given inability to ambulate and severe pain.   Rada Hay, MD 03/09/22 2620    Rada Hay, MD 03/09/22 [phone]

## 2022-03-09 NOTE — ED Notes (Addendum)
Meal tray provided to pt by dietary. Pt placed in high fowlers position to eat. No further needs expressed at this time. WCTM.

## 2022-03-09 NOTE — ED Notes (Signed)
Patient transported to MRI 

## 2022-03-09 NOTE — H&P (Signed)
History and Physical    Victoria Prince DGU:[id number] DOB: July 06, 1928 DOA: 03/08/2022  PCP: Sofie Hartigan, MD (Confirm with patient/family/NH records and if not entered, this has to be entered at [facility] point of entry) Patient coming from: Home  I have personally briefly reviewed patient's old medical records in [place]  Chief Complaint: Right leg pain and weak  HPI: Victoria Prince is a 88 y.o. 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  Patient has residual sciatica, chronic back pain and radiating to the right leg, she underwent lumbar injection 8 years ago and has had some relief.  Sunday morning, patient woke up with severe recurrent lower back pain radiating to right side of leg and right foot with numbness of right foot, whole day yesterday she has been trouble putting weight on the right leg and foot because of the pain.  Denies any urine or bowel movement problems no fever or chills.  She took some as needed naproxen with minimal relief.  ED Course: Afebrile, no tachycardia no hypotension.  MRI showed increased chronic L4-L5 L5 by 1 severe multifactorial spinal and bilateral recess stenosis.  Query of right L5 and S1 radicullitis  Patient was given several rounds of morphine and 1 dose of IV Solu-Medrol, pain improved.  Review of Systems: As per HPI otherwise 14 point review of systems negative.    Past Medical History:  Diagnosis Date   Breast cancer ([place]) 2011   left breast lumpectomy with rad and chemo tx   Personal history of chemotherapy 2011   left breast   Personal history of radiation therapy 2011   left breast   Radiation 2011   S/P chemotherapy, time since greater than 12 weeks 2011    Past Surgical History:  Procedure Laterality Date   BREAST BIOPSY Left  2012   negative   BREAST BIOPSY Left 2011   2 areas removed. Retroaerola area benign.  UOQ was DCIS with positive margin per Dr. Rogue Bussing notes.    BREAST BIOPSY Right 12/26/2019   [place] bx, Q marker, path pending   SKIN LESION EXCISION  06/2021     reports that she has never smoked. She has never used smokeless tobacco. She reports that she does not drink alcohol and does not use drugs.  Allergies  Allergen Reactions   Sulfa Antibiotics Rash    Family History  Problem Relation Age of Onset   Breast cancer Neg Hx     Prior to Admission medications   Medication Sig Start Date End Date Taking? Authorizing Provider  amLODipine (NORVASC) 2.5 MG tablet Take 2.5 mg by mouth daily.    Yes [provider]  cetirizine (ZYRTEC) 10 MG tablet Take 1 tablet by mouth daily. 11/25/18  Yes [provider]  pravastatin (PRAVACHOL) 40 MG tablet Take 40 mg by mouth daily.    Yes [provider]  triamcinolone cream (KENALOG) 0.5 % Apply topically as directed. 12/11/20  Yes [provider]  alendronate (FOSAMAX) 70 MG tablet Take 1 tablet (70 mg total) by mouth once a week. Take with a full glass of water on an empty stomach; sit upright for 3 hours. Patient not taking: Reported on 03/09/2022 12/30/21   Cammie Sickle, MD  amitriptyline (ELAVIL) 25 MG tablet Take 25 mg by mouth at bedtime.  [provider]  azithromycin (ZITHROMAX Z-PAK) 250 MG tablet Take 2 tablets (500 mg) on  Day 1,  followed by 1 tablet (250 mg) once daily on Days 2 through 5. Patient not taking: Reported on 03/09/2022 02/02/22   Duanne Guess, PA-C  benzonatate (TESSALON) 200 MG capsule Take 1 capsule (200 mg total) by mouth 3 (three) times daily. Patient not taking: Reported on 03/09/2022 02/02/22   Duanne Guess, PA-C  clobetasol cream (TEMOVATE) 9.62 % Apply 1 Application topically as needed.  Patient not taking: Reported on 12/30/2021    [provider]   levocetirizine (XYZAL) 5 MG tablet Take 10 mg by mouth every evening. Patient not taking: Reported on 03/09/2022    [provider]  naproxen sodium (ALEVE) 220 MG tablet Take 220 mg by mouth daily as needed (pain).    [provider]    Physical Exam: Vitals:   03/08/22 1946 03/08/22 1947 03/09/22 0330 03/09/22 0700  BP: (!) 159/71  (!) 145/71 139/61  Pulse: 81  85 73  Resp: 15  16   Temp: 98.3 F (36.8 C)  98.3 F (36.8 C)   TempSrc: Oral  Oral   SpO2: 100%  97% 95%  Weight:  66.2 kg    Height:  '5\' 2"'$  (1.575 m)      Constitutional: NAD, calm, comfortable Vitals:   03/08/22 1946 03/08/22 1947 03/09/22 0330 03/09/22 0700  BP: (!) 159/71  (!) 145/71 139/61  Pulse: 81  85 73  Resp: 15  16   Temp: 98.3 F (36.8 C)  98.3 F (36.8 C)   TempSrc: Oral  Oral   SpO2: 100%  97% 95%  Weight:  66.2 kg    Height:  '5\' 2"'$  (1.575 m)     Eyes: PERRL, lids and conjunctivae normal ENMT: Mucous membranes are moist. Posterior pharynx clear of any exudate or lesions.Normal dentition.  Neck: normal, supple, no masses, no thyromegaly Respiratory: clear to auscultation bilaterally, no wheezing, no crackles. Normal respiratory effort. No accessory muscle use.  Cardiovascular: Regular rate and rhythm, no murmurs / rubs / gallops. No extremity edema. 2+ pedal pulses. No carotid bruits.  Abdomen: no tenderness, no masses palpated. No hepatosplenomegaly. Bowel sounds positive.  Musculoskeletal: no clubbing / cyanosis. No joint deformity upper and lower extremities. Good ROM, no contractures. Normal muscle tone.  Skin: no rashes, lesions, ulcers. No induration Neurologic: CN 2-12 grossly intact. Sensation intact, DTR normal.  Muscle strength 4/5 of right foot eversion dorsal extension compared to 5/5 on the left side, decreased light touch sensation of right foot compared to left Psychiatric: Normal judgment and insight. Alert and oriented x 3. Normal mood.     Labs on Admission: I  have personally reviewed following labs and imaging studies  CBC: No results for input(s): "WBC", "NEUTROABS", "HGB", "HCT", "MCV", "PLT" in the last 168 hours. Basic Metabolic Panel: No results for input(s): "NA", "K", "CL", "CO2", "GLUCOSE", "BUN", "CREATININE", "CALCIUM", "MG", "PHOS" in the last 168 hours. GFR: CrCl cannot be calculated (Patient's most recent lab result is older than the maximum 21 days allowed.). Liver Function Tests: No results for input(s): "AST", "ALT", "ALKPHOS", "BILITOT", "PROT", "ALBUMIN" in the last 168 hours. No results for input(s): "LIPASE", "AMYLASE" in the last 168 hours. No results for input(s): "AMMONIA" in the last 168 hours. Coagulation Profile: No results for input(s): "INR", "PROTIME" in the last 168 hours. Cardiac Enzymes: No results for input(s): "CKTOTAL", "CKMB", "CKMBINDEX", "TROPONINI" in the last 168 hours.  BNP (last 3 results) No results for input(s): "PROBNP" in the last 8760 hours. HbA1C: No results for input(s): "HGBA1C" in the last 72 hours. CBG: No results for input(s): "GLUCAP" in the last 168 hours. Lipid Profile: No results for input(s): "CHOL", "HDL", "LDLCALC", "TRIG", "CHOLHDL", "LDLDIRECT" in the last 72 hours. Thyroid Function Tests: No results for input(s): "TSH", "T4TOTAL", "FREET4", "T3FREE", "THYROIDAB" in the last 72 hours. Anemia Panel: No results for input(s): "VITAMINB12", "FOLATE", "FERRITIN", "TIBC", "IRON", "RETICCTPCT" in the last 72 hours. Urine analysis:    Component Value Date/Time   COLORURINE Straw 04/18/2014 1343   APPEARANCEUR Clear 04/18/2014 1343   LABSPEC 1.004 04/18/2014 1343   PHURINE 6.0 04/18/2014 1343   GLUCOSEU Negative 04/18/2014 1343   HGBUR Negative 04/18/2014 1343   BILIRUBINUR Negative 04/18/2014 1343   KETONESUR Negative 04/18/2014 1343   PROTEINUR Negative 04/18/2014 1343   NITRITE Negative 04/18/2014 1343   LEUKOCYTESUR Trace 04/18/2014 1343    Radiological Exams on  Admission: MR LUMBAR SPINE WO CONTRAST  Result Date: 03/09/2022 CLINICAL DATA:  88 year old female with back pain radiating to the right hip and leg. No known injury. EXAM: MRI LUMBAR SPINE WITHOUT CONTRAST TECHNIQUE: Multiplanar, multisequence MR imaging of the lumbar spine was performed. No intravenous contrast was administered. COMPARISON:  Lumbar MRI 11/28/2013. FINDINGS: Segmentation: Lumbar segmentation appears to be normal and will be designated as such for this report. This is the same numbering system used on the 2015 MRI. Alignment: Chronically preserved lumbar lordosis. Grade 1 anterolisthesis of L4 on L5 has increased since 2015, 4-5 mm now. See additional details of that level below. Mild dextroconvex lumbar scoliosis is chronic. Normal alignment otherwise. Vertebrae: Visualized bone marrow signal is within normal limits. No marrow edema or evidence of acute osseous abnormality. Intact visible sacrum. Conus medullaris and cauda equina: Conus extends to the T12-L1 level. No lower spinal cord or conus signal abnormality. Capacious spinal canal above L4-L5. Generally normal cauda equina nerve roots. Paraspinal and other soft tissues: Benign right renal cyst has regressed (no follow-up imaging recommended). Diverticulosis of the visible large bowel. Small round simple 1.9 cm right ovarian cyst is visible at the pelvic inlet series 8, image 33. 1.9 cm right ovarian simple-appearing cyst. No follow-up imaging is recommended. Reference: JACR 2020 Feb;17(2):248-254 However, the uterine endometrial stripe is also visible on series 5, image 8 and measures 8-9 mm (up to 8 mm is within normal limits but 3 mm is average). Disc levels: Normal for age T11-T12 through L3-L4; mild to moderate facet and ligament flavum hypertrophy at those levels has increased since 2015. L4-L5: Grade 1 anterolisthesis with mild circumferential disc/pseudo disc. Severe facet and ligament flavum hypertrophy with progression since 2015.  Degenerative facet joint fluid. Progressed and now severe spinal stenosis, maximal just below the disc space level (series 5, image 8 and series 8 image 30). Right greater than left lateral recess involvement (L5 nerve levels). Only borderline to mild L4 foraminal stenosis. L5-S1: Bulky chronic left far lateral disc osteophyte complex. Moderate to severe facet and ligament flavum hypertrophy with chronic facet joint fluid. No spinal stenosis. Mild to moderate left and moderate to severe right lateral recess stenosis is greater on the right (right S1 nerve level) and mildly progressed since 2015 (series 5, image 6). No foraminal stenosis. IMPRESSION: 1. Increased chronic grade 1 anterolisthesis at L4-L5 along with severe facet arthropathy since 2015. Increased chronic spinal and lateral recess stenosis there, now severe. And severe posterior element degeneration also at L5-S1, with  increased moderate to severe right lateral recess stenosis at that level. Query Right L5 and/or S1 radiculitis. 2. No acute osseous abnormality. And other lumbar levels are normal for age. 3. Borderline abnormal endometrial thickening for an asymptomatic postmenopausal patient. Endometrial sampling should be considered to exclude carcinoma. Electronically Signed   By: Genevie Ann M.D.   On: 03/09/2022 04:55   CT Hip Right Wo Contrast  Result Date: 03/09/2022 CLINICAL DATA:  Hip pain, stress fracture suspected. EXAM: CT OF THE RIGHT HIP WITHOUT CONTRAST TECHNIQUE: Multidetector CT imaging of the right hip was performed according to the standard protocol. Multiplanar CT image reconstructions were also generated. RADIATION DOSE REDUCTION: This exam was performed according to the departmental dose-optimization program which includes automated exposure control, adjustment of the mA and/or kV according to patient size and/or use of iterative reconstruction technique. COMPARISON:  Right hip x-ray 03/08/2022. FINDINGS: Bones/Joint/Cartilage No  evidence for fracture. No focal osseous lesion. No significant joint effusion. Ligaments Suboptimally assessed by CT. Muscles and Tendons Within normal limits. Soft tissues There is subcutaneous stranding and small amount of hyperdensity in the inferior right gluteal region which may represent a small amount of hematoma. No fluid collection. IMPRESSION: 1. No acute fracture or dislocation of the right hip. 2. Small amount of inferior right gluteal subcutaneous hyperdensity may represent hematoma. Correlate clinically. Electronically Signed   By: Ronney Asters M.D.   On: 03/09/2022 02:09   DG Hip Unilat W or Wo Pelvis 2-3 Views Right  Result Date: 03/08/2022 CLINICAL DATA:  Pain. EXAM: DG HIP (WITH OR WITHOUT PELVIS) 2-3V RIGHT COMPARISON:  None Available. FINDINGS: There is no evidence of hip fracture or dislocation. Left hip arthroplasty appears uncomplicated. There is no evidence of arthropathy or other focal bone abnormality. IMPRESSION: Negative. Electronically Signed   By: Ronney Asters M.D.   On: 03/08/2022 20:44   DG Knee Right Port  Result Date: 03/08/2022 CLINICAL DATA:  Right leg and hip pain, no known injury EXAM: PORTABLE RIGHT KNEE - 1-2 VIEW COMPARISON:  None Available. FINDINGS: No fracture or dislocation of the right knee. Severe tricompartmental joint space narrowing and osteophytosis. Small, nonspecific knee joint effusion. Soft tissues unremarkable. IMPRESSION: 1. No fracture or dislocation of the right knee. 2. Severe tricompartmental osteoarthritis. 3. Small, nonspecific right knee joint effusion. Electronically Signed   By: Delanna Ahmadi M.D.   On: 03/08/2022 20:34    EKG: None  Assessment/Plan Principal Problem:   Intractable back pain Active Problems:   Impaired ambulation  (please populate well all problems here in Problem List. (For example, if patient is on BP meds at home and you resume or decide to hold them, it is a problem that needs to be her. Same for CAD, COPD, HLD  and so on)  Acute on chronic lumbar stenosis and acute ambulation dysfunction -With worsening symptoms of sciatica on the right side, acute ambulation dysfunction -Physical exam correlated with MRI findings, no symptoms or signs of equina syndrome -Pain control and physical therapy -Given the significant MRI finding of questions acute radiculitis, will continue steroid Decadron 4 mg twice daily  HTN -Continue amlodipine  HLD -Continue pravastatin  CKD stage II -Creatinine level stable, euvolemic, no acute concerns  Remote history of breast cancer -No acute concerns.  DVT prophylaxis: Lovenox Code Status: Full code Family Communication: Husband at bedside Disposition Plan: Expect less than 2 midnight hospital Consults called: None Admission status: MedSurg observation   Lequita Halt MD [facility] Pager [phone]  03/09/2022, 9:20 AM

## 2022-03-09 NOTE — ED Notes (Signed)
Pt taken to restroom by this RN via wheelchair without difficulty. Pt will require at least x1 assist for further toileting. Pt is able to bare weight but does not move well independently. Pt taken back to bed, positioned for comfort. No further needs expressed at this time. Call light is in reach, [facility].

## 2022-03-09 NOTE — Evaluation (Signed)
Physical Therapy Evaluation Patient Details Name: Victoria Prince MRN: [id number] DOB: December 12, 1928 Today's Date: 03/09/2022  History of Present Illness  Victoria Prince is a 88 y.o. 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   Clinical Impression  Pt admitted with above diagnosis. Pt received upright in bed agreeable to PT services. Reports at baseline she is indep with mobility and ADL's/IADL's. Lives with son who can assist 24/7 if needed. Education provided on log roll technique to assist in pain management entering and exiting bed. Requiring mod to max VC cuing throughout log roll  but overall performs without physical assist. Noted positive slump on RLE for radicular pain. Unable to assess MMT and sensation due to episodes of vomiting. B/t periods of vomiting education provided on sciatic nerve floss for radicular pain and supine knees to chest for lumbar flexion due to lumbar stenosis. X1 STS performed without AD with hands on knees to stand. In standing pt's RLE abducted and unable to place RLE under BOS due to pain in limb relying on HHA+1 standing to maintain balance. Pt requesting seat as additional vomiting episodes continue. Further mobility deferred due to vomiting. Anticipate if pt able to improve pain, she will be able to d/c home with OP PT however due to inability to perform gait, PT to rec STR at this time. Pt placed in supine with knees bent to bias lumbar flexion for pain management. Pt currently with functional limitations due to the deficits listed below (see PT Problem List). Pt will benefit from skilled PT to increase their independence and safety with mobility to allow discharge to the venue listed below.     Recommendations for follow up therapy are one component of a multi-disciplinary  discharge planning process, led by the attending physician.  Recommendations may be updated based on patient status, additional functional criteria and insurance authorization.  Follow Up Recommendations Skilled nursing-short term rehab (<3 hours/day) Can patient physically be transported by private vehicle: No    Assistance Recommended at Discharge Intermittent Supervision/Assistance  Patient can return home with the following  A lot of help with walking and/or transfers;A lot of help with bathing/dressing/bathroom;Assist for transportation;Help with stairs or ramp for entrance    Equipment Recommendations None recommended by PT  Recommendations for Other Services       Functional Status Assessment Patient has had a recent decline in their functional status and demonstrates the ability to make significant improvements in function in a reasonable and predictable amount of time.     Precautions / Restrictions Precautions Precautions: Fall Precaution Booklet Issued: No Restrictions Weight Bearing Restrictions: No      Mobility  Bed Mobility Overal bed mobility: Modified Independent             General bed mobility comments: increased time. Education on log roll technique. Patient Response: Cooperative  Transfers Overall transfer level: Needs assistance Equipment used: None Transfers: Sit to/from Stand Sit to Stand: Min guard           General transfer comment: Hands on knees to stand.    Ambulation/Gait               General Gait Details: deferred due to vomiting  Stairs            Wheelchair Mobility    Modified Rankin (Stroke Patients Only)  Balance Overall balance assessment: Needs assistance Sitting-balance support: Bilateral upper extremity supported, Feet supported Sitting balance-Leahy Scale: Good       Standing balance-Leahy Scale: Poor Standing balance comment: Reliant on HHA+1 to stand. UNable to place RLE under BOS due  to pain.                             Pertinent Vitals/Pain Pain Assessment Pain Assessment: Faces Faces Pain Scale: Hurts even more Pain Location: low back and RLE Pain Descriptors / Indicators: Sharp, Shooting, Grimacing Pain Intervention(s): Limited activity within patient's tolerance    Home Living Family/patient expects to be discharged to:: Private residence Living Arrangements: Children Available Help at Discharge: Family;Available 24 hours/day Type of Home: House Home Access: Ramped entrance       Home Layout: One level Home Equipment: Conservation officer, nature (2 wheels);Cane - single point;Crutches;Grab bars - tub/shower;Wheelchair - manual      Prior Function Prior Level of Function : Independent/Modified Independent                     Hand Dominance        Extremity/Trunk Assessment   Upper Extremity Assessment Upper Extremity Assessment: Overall WFL for tasks assessed    Lower Extremity Assessment Lower Extremity Assessment: Generalized weakness;RLE deficits/detail RLE Deficits / Details: pain limiting adequate weightbearing    Cervical / Trunk Assessment Cervical / Trunk Assessment: Normal  Communication   Communication: No difficulties  Cognition Arousal/Alertness: Awake/alert Behavior During Therapy: WFL for tasks assessed/performed Overall Cognitive Status: Within Functional Limits for tasks assessed                                          General Comments      Exercises Other Exercises Other Exercises: + slump on RLE. Education on sciatic nerve flossing techniques and lumbar flexion due to stenosis to assist in pain management.   Assessment/Plan    PT Assessment Patient needs continued PT services  PT Problem List Decreased strength;Decreased mobility;Pain;Decreased balance       PT Treatment Interventions DME instruction;Therapeutic exercise;Gait training;Balance training;Neuromuscular  re-education;Therapeutic activities;Patient/family education;Functional mobility training    PT Goals (Current goals can be found in the Care Plan section)  Acute Rehab PT Goals Patient Stated Goal: improve pain PT Goal Formulation: With patient Time For Goal Achievement: 03/23/22 Potential to Achieve Goals: Fair    Frequency Min 2X/week     Co-evaluation               AM-PAC PT "6 Clicks" Mobility  Outcome Measure Help needed turning from your back to your side while in a flat bed without using bedrails?: A Little Help needed moving from lying on your back to sitting on the side of a flat bed without using bedrails?: A Little Help needed moving to and from a bed to a chair (including a wheelchair)?: A Lot Help needed standing up from a chair using your arms (e.g., wheelchair or bedside chair)?: A Lot Help needed to walk in hospital room?: Total Help needed climbing 3-5 steps with a railing? : Total 6 Click Score: 12    End of Session   Activity Tolerance: Patient limited by pain;Treatment limited secondary to medical complications (Comment) (multiple episodes of vomiting) Patient left: in bed;with call bell/phone within reach;with bed alarm set Nurse Communication:  Mobility status PT Visit Diagnosis: Other abnormalities of gait and mobility (R26.89);Muscle weakness (generalized) (M62.81);Pain Pain - Right/Left: Right Pain - part of body: Leg    Time: 3837-7939 PT Time Calculation (min) (ACUTE ONLY): 31 min   Charges:   PT Evaluation $PT Eval Moderate Complexity: 1 Mod PT Treatments $Therapeutic Exercise: 8-22 mins        Robertta Halfhill M. Fairly IV, PT, DPT Physical Therapist- [facility]  03/09/2022, 4:11 PM

## 2022-03-09 NOTE — ED Notes (Signed)
Pt had one episode of vomiting prior to being transferred to inpatient unit. PRN zofran given.

## 2022-03-09 NOTE — ED Notes (Signed)
This RN to bedside. Pt is resting comfortably on IP bed at this time. Respirations are even and unlabored, skin AfE/WD. VSS, NAD at this time. Pt denies any needs at this time, denies need for pain medication. Call light placed in reach and pt instructed on use. WCTM.

## 2022-03-10 DIAGNOSIS — Z853 Personal history of malignant neoplasm of breast: Secondary | ICD-10-CM | POA: Diagnosis not present

## 2022-03-10 DIAGNOSIS — Z882 Allergy status to sulfonamides status: Secondary | ICD-10-CM | POA: Diagnosis not present

## 2022-03-10 DIAGNOSIS — M48062 Spinal stenosis, lumbar region with neurogenic claudication: Secondary | ICD-10-CM | POA: Diagnosis present

## 2022-03-10 DIAGNOSIS — E785 Hyperlipidemia, unspecified: Secondary | ICD-10-CM | POA: Diagnosis present

## 2022-03-10 DIAGNOSIS — M4800 Spinal stenosis, site unspecified: Secondary | ICD-10-CM | POA: Diagnosis present

## 2022-03-10 DIAGNOSIS — I129 Hypertensive chronic kidney disease with stage 1 through stage 4 chronic kidney disease, or unspecified chronic kidney disease: Secondary | ICD-10-CM | POA: Diagnosis present

## 2022-03-10 DIAGNOSIS — N1831 Chronic kidney disease, stage 3a: Secondary | ICD-10-CM | POA: Diagnosis present

## 2022-03-10 DIAGNOSIS — Z9221 Personal history of antineoplastic chemotherapy: Secondary | ICD-10-CM | POA: Diagnosis not present

## 2022-03-10 DIAGNOSIS — M5416 Radiculopathy, lumbar region: Secondary | ICD-10-CM

## 2022-03-10 DIAGNOSIS — Z79899 Other long term (current) drug therapy: Secondary | ICD-10-CM | POA: Diagnosis not present

## 2022-03-10 DIAGNOSIS — M48061 Spinal stenosis, lumbar region without neurogenic claudication: Secondary | ICD-10-CM | POA: Diagnosis not present

## 2022-03-10 DIAGNOSIS — M549 Dorsalgia, unspecified: Secondary | ICD-10-CM | POA: Diagnosis not present

## 2022-03-10 DIAGNOSIS — G479 Sleep disorder, unspecified: Secondary | ICD-10-CM | POA: Diagnosis present

## 2022-03-10 DIAGNOSIS — Z923 Personal history of irradiation: Secondary | ICD-10-CM | POA: Diagnosis not present

## 2022-03-10 DIAGNOSIS — J449 Chronic obstructive pulmonary disease, unspecified: Secondary | ICD-10-CM | POA: Diagnosis present

## 2022-03-10 DIAGNOSIS — Z7983 Long term (current) use of bisphosphonates: Secondary | ICD-10-CM | POA: Diagnosis not present

## 2022-03-10 DIAGNOSIS — G8929 Other chronic pain: Secondary | ICD-10-CM | POA: Diagnosis present

## 2022-03-10 MED ORDER — ENSURE ENLIVE PO LIQD: 237.0000 mL | Freq: Two times a day (BID) | ORAL | Status: DC

## 2022-03-10 MED ORDER — SODIUM CHLORIDE 0.9 % IV SOLN: INTRAVENOUS | Status: DC

## 2022-03-10 MED ORDER — ADULT MULTIVITAMIN W/MINERALS CH
1.0000 | ORAL_TABLET | Freq: Every day | ORAL | Status: DC
  Administered 2022-03-10 – 2022-03-12 (×3): 1 via ORAL
  Filled 2022-03-10 (×3): qty 1

## 2022-03-10 NOTE — TOC Progression Note (Signed)
Transition of Care [facility]) - Progression Note    Patient Details  Name: Victoria Prince MRN: [id number] Date of Birth: 10-Feb-1929  [facility] CM/SW Contact  Beverly Sessions, RN Phone Number: 03/10/2022, 1:58 PM  Clinical Narrative:      Bed offer presented.  Son accepts bed at [facility] with TOC to start auth Neurosurgeon consult pending   Expected Discharge Plan: [place] Barriers to Discharge: Continued Medical Work up  Expected Discharge Plan and Services Expected Discharge Plan: [place]                                               Social Determinants of Health (SDOH) Interventions Housing Interventions: Intervention Not Indicated  Readmission Risk Interventions     No data to display

## 2022-03-10 NOTE — Consult Note (Signed)
Referring Physician:  No referring provider defined for this encounter.  Primary Physician:  Sofie Hartigan, MD  History of Present Illness: 03/10/2022 [name] is here today with a chief complaint of trouble walking due to back and RLE pain.  She reports that she has been having worsening pain and difficulty over the past several weeks to months.  She was previously ambulatory without assistance but is now having trouble walking more than a few steps without assistance.  She was having trouble putting weight on her right lower extremity due to pain.  Her son brought her to the hospital due to severe radiating pain in her right leg on the lateral aspect as well as difficulty walking.  Her son is also noted that she has had some minor confusion over the last 2 months or so.  She normally lives at home with her son and is able to take care of her affairs.   Review of Systems:  A 10 point review of systems is negative, except for the pertinent positives and negatives detailed in the HPI.  Past Medical History: Past Medical History:  Diagnosis Date   Breast cancer ([place]) 2011   left breast lumpectomy with rad and chemo tx   Personal history of chemotherapy 2011   left breast   Personal history of radiation therapy 2011   left breast   Radiation 2011   S/P chemotherapy, time since greater than 12 weeks 2011    Past Surgical History: Past Surgical History:  Procedure Laterality Date   BREAST BIOPSY Left 2012   negative   BREAST BIOPSY Left 2011   2 areas removed. Retroaerola area benign.  UOQ was DCIS with positive margin per Dr. Rogue Bussing notes.    BREAST BIOPSY Right 12/26/2019   [place] bx, Q marker, path pending   SKIN LESION EXCISION  06/2021    Allergies: Allergies as of 03/08/2022 - Review Complete 03/08/2022  Allergen Reaction Noted   Sulfa antibiotics Rash 01/08/2015    Medications: Current Meds  Medication Sig   amLODipine (NORVASC) 2.5 MG tablet  Take 2.5 mg by mouth daily.    cetirizine (ZYRTEC) 10 MG tablet Take 1 tablet by mouth daily.   pravastatin (PRAVACHOL) 40 MG tablet Take 40 mg by mouth daily.    triamcinolone cream (KENALOG) 0.5 % Apply topically as directed.    Social History: Social History   Tobacco Use   Smoking status: Never   Smokeless tobacco: Never  Vaping Use   Vaping Use: Never used  Substance Use Topics   Alcohol use: Never   Drug use: Never    Family Medical History: Family History  Problem Relation Age of Onset   Breast cancer Neg Hx     Physical Examination: Vitals:   03/10/22 0901 03/10/22 1739  BP: (!) 141/74 (!) 142/64  Pulse: 80 89  Resp: 20   Temp: 97.8 F (36.6 C) 98.1 F (36.7 C)  SpO2: 98% 97%    General: Patient is well developed, well nourished, calm, collected, and in no apparent distress. Attention to examination is appropriate.  Neck:   Supple.    Respiratory: Patient is breathing without any difficulty.   NEUROLOGICAL:     Awake, alert, oriented to person and time.  She feels she is at a football game.  Speech is clear and fluent. Fund of knowledge is appropriate.   Cranial Nerves: Pupils equal round and reactive to light.  Facial tone is symmetric.  Facial sensation  is symmetric. Shoulder shrug is symmetric. Tongue protrusion is midline.  There is no pronator drift.  ROM of spine: full.    Strength: Side Biceps Triceps Deltoid Interossei Grip Wrist Ext. Wrist Flex.  R '5 5 5 5 5 5 5  '$ L '5 5 5 5 5 5 5   '$ Side Iliopsoas Quads Hamstring PF DF EHL  R '5 5 5 5 '$ 4+ 4+  L '5 5 5 5 5 5   '$ Reflexes are 1+ and symmetric at the biceps, triceps, brachioradialis, patella and achilles.   Hoffman's is absent.  Clonus is not present.  Toes are down-going.  Bilateral upper and lower extremity sensation is intact to light touch.    No evidence of dysmetria noted.  Gait is untested  Medical Decision Making  Imaging: MRI L spine 03/09/22 IMPRESSION: 1. Increased chronic  grade 1 anterolisthesis at L4-L5 along with severe facet arthropathy since 2015. Increased chronic spinal and lateral recess stenosis there, now severe. And severe posterior element degeneration also at L5-S1, with increased moderate to severe right lateral recess stenosis at that level. Query Right L5 and/or S1 radiculitis.   2. No acute osseous abnormality. And other lumbar levels are normal for age.   3. Borderline abnormal endometrial thickening for an asymptomatic postmenopausal patient. Endometrial sampling should be considered to exclude carcinoma.     Electronically Signed   By: Genevie Ann M.D.   On: 03/09/2022 04:55 I have personally reviewed the images and agree with the above interpretation.  Assessment and Plan: Victoria Prince is a pleasant 88 y.o. female with neurogenic claudication and severe right-sided lumbar radiculopathy.  She is also having some mild confusion.  I would recommend that she continue physical therapy and occupational therapy as well as medical pain control.  She may be a candidate for injections, but that is normally arranged as an outpatient.  1 could consider an L4-5 decompression, but I have recommended strong consideration to conservative management before consideration of that given her advanced age and other medical issues.    Thank you for involving me in the care of this patient.      Ranard Harte K. Izora Ribas MD, [facility] Neurosurgery

## 2022-03-10 NOTE — NC FL2 (Signed)
[place] LEVEL OF CARE SCREENING TOOL     IDENTIFICATION  Patient Name: SHAUNE MALACARA Birthdate: February 23, 1929 Sex: female Admission Date (Current Location): 03/08/2022  [facility] and [place] Number:  Engineering geologist and Address:         Provider Number: [id number]  Attending Physician Name and Address:  Enzo Bi, MD  Relative Name and Phone Number:       Current Level of Care: Hospital Recommended Level of Care: [place] Prior Approval Number:    Date Approved/Denied:   PASRR Number: [id number]  Discharge Plan: SNF    Current Diagnoses: Patient Active Problem List   Diagnosis Date Noted   Intractable back pain 03/09/2022   Impaired ambulation 03/09/2022   Candida infection 12/13/2019   Age-related osteoporosis without current pathological fracture 01/15/2019   Carcinoma of upper-outer quadrant of left breast in female, estrogen receptor positive ([place]) 01/20/2016   Cancer of breast ([place]) 01/12/2015   H/O total hip arthroplasty 11/11/2014   Neuritis or radiculitis due to rupture of lumbar intervertebral disc 01/10/2014   DDD (degenerative disc disease), lumbar 01/10/2014   Lumbar canal stenosis 01/10/2014    Orientation RESPIRATION BLADDER Height & Weight     Self, Time, Situation, Place  Normal Continent Weight: 62 kg Height:  '5\' 1"'$  (154.9 cm)  BEHAVIORAL SYMPTOMS/MOOD NEUROLOGICAL BOWEL NUTRITION STATUS      Continent Diet (Heart Healthy)  AMBULATORY STATUS COMMUNICATION OF NEEDS Skin   Extensive Assist Verbally Normal                       Personal Care Assistance Level of Assistance              Functional Limitations Info             SPECIAL CARE FACTORS FREQUENCY  PT (By licensed PT), OT (By licensed OT)                    Contractures      Additional Factors Info  Code Status Code Status Info: Full Allergies Info: Sulfa Antibiotics           Current Medications (03/10/2022):   This is the current hospital active medication list Current Facility-Administered Medications  Medication Dose Route Frequency Provider Last Rate Last Admin   0.9 %  sodium chloride infusion   Intravenous Continuous Enzo Bi, MD       amitriptyline (ELAVIL) tablet 25 mg  25 mg Oral QHS Wynetta Fines T, MD   25 mg at 03/09/22 2111   amLODipine (NORVASC) tablet 2.5 mg  2.5 mg Oral Daily Wynetta Fines T, MD   2.5 mg at 03/10/22 0906   bisacodyl (DULCOLAX) EC tablet 5 mg  5 mg Oral Daily PRN Wynetta Fines T, MD       dexamethasone (DECADRON) tablet 4 mg  4 mg Oral Q12H Wynetta Fines T, MD   4 mg at 03/10/22 0907   enoxaparin (LOVENOX) injection 40 mg  40 mg Subcutaneous Q24H Wynetta Fines T, MD   40 mg at 03/09/22 2109   loratadine (CLARITIN) tablet 10 mg  10 mg Oral Daily Wynetta Fines T, MD   10 mg at 03/10/22 0906   naproxen (NAPROSYN) tablet 250 mg  250 mg Oral Daily PRN Lequita Halt, MD       ondansetron [facility]) injection 4 mg  4 mg Intravenous Q6H PRN Lequita Halt, MD   4 mg at  03/09/22 1528   Oral care mouth rinse  15 mL Mouth Rinse PRN Wynetta Fines T, MD       oxyCODONE (Oxy IR/ROXICODONE) immediate release tablet 5 mg  5 mg Oral Q4H PRN Wynetta Fines T, MD   5 mg at 03/10/22 0906   pravastatin (PRAVACHOL) tablet 40 mg  40 mg Oral QPM Lequita Halt, MD   40 mg at 03/09/22 1838   senna-docusate (Senokot-S) tablet 1 tablet  1 tablet Oral QHS PRN Lequita Halt, MD         Discharge Medications: Please see discharge summary for a list of discharge medications.  Relevant Imaging Results:  Relevant Lab Results:   Additional Information ss [id number]  Beverly Sessions, RN

## 2022-03-10 NOTE — Progress Notes (Addendum)
  PROGRESS NOTE    Victoria Prince  IAX:[id number] DOB: [birth year] DOA: 03/08/2022 PCP: Sofie Hartigan, MD  [id number]  LOS: 0 days   Brief hospital course:   Assessment & Plan: Victoria Prince is a 88 y.o. female with medical history significant of HTN, lumbar stenosis with sciatica, breast cancer status post lumpectomy radiation and chemotherapy, presented with right-sided back pain, leg pain and leg weakness x1 day.   Patient has residual sciatica, chronic back pain and radiating to the right leg, she underwent lumbar injection 8 years ago and has had some relief.  Sunday morning, patient woke up with severe recurrent lower back pain radiating to right side of leg and right foot with numbness of right foot, whole day yesterday she has been trouble putting weight on the right leg and foot because of the pain.  Denies any urine or bowel movement problems no fever or chills.  She took some as needed naproxen with minimal relief.  Neurogenic claudication  Severe right-sided lumbar radiculopathy --resulting in ambulatory dysfunction --Given the MRI finding of questions acute radiculitis, started on steroid Decadron 4 mg twice daily Plan: --cont pain control --cont decadron for now --Neurosurgery consult today --PT/OT   HTN -Continue amlodipine   HLD -Continue pravastatin   CKD stage 3a --recent GFR indicated stage 3a   Remote history of breast cancer -No acute concerns.  Borderline abnormal endometrial thickening  --need outpatient ObGyn f/u    DVT prophylaxis: Lovenox SQ Code Status: Full code  Family Communication: son updated at bedside today Level of care: Med-Surg Dispo:   The patient is from: home Anticipated d/c is to: SNF rehab Anticipated d/c date is: whenever bed available   Subjective and Interval History:  Pt reported pain improved today.   Objective: Vitals:   03/09/22 2106 03/10/22 0541 03/10/22 0901 03/10/22 1739  BP: 139/65 116/66 (!)  141/74 (!) 142/64  Pulse: 91 72 80 89  Resp: '16 20 20   '$ Temp: 98.3 F (36.8 C) 98.4 F (36.9 C) 97.8 F (36.6 C) 98.1 F (36.7 C)  TempSrc: Oral Oral Oral Oral  SpO2: 94% 92% 98% 97%  Weight:      Height:        Intake/Output Summary (Last 24 hours) at 03/10/2022 1935 Last data filed at 03/10/2022 1744 Gross per 24 hour  Intake 101.56 ml  Output --  Net 101.56 ml   Filed Weights   03/08/22 1947 03/09/22 1550  Weight: 66.2 kg 62 kg    Examination:   Constitutional: NAD, AAOx3 HEENT: conjunctivae and lids normal, EOMI CV: No cyanosis.   RESP: normal respiratory effort, on RA Neuro: II - XII grossly intact.   Psych: Normal mood and affect.     Data Reviewed: I have personally reviewed labs and imaging studies  Time spent: 50 minutes  Enzo Bi, MD [facility] If 7PM-7AM, please contact night-coverage 03/10/2022, 7:35 PM

## 2022-03-10 NOTE — Progress Notes (Signed)
Initial Nutrition Assessment  DOCUMENTATION CODES:   Not applicable  INTERVENTION:   -Liberalize diet to regular for wider variety of meal selections -MVI with minerals daily -Ensure Enlive po BID, each supplement provides 350 kcal and 20 grams of protein  NUTRITION DIAGNOSIS:   Increased nutrient needs related to chronic illness (breast cancer) as evidenced by estimated needs.  GOAL:   Patient will meet greater than or equal to 90% of their needs  MONITOR:   PO intake, Supplement acceptance  REASON FOR ASSESSMENT:   Malnutrition Screening Tool    ASSESSMENT:   Pt with medical history significant of HTN HLD, CKD stage II lumbar stenosis with sciatica, breast cancer status post lumpectomy radiation and chemotherapy, presented with right-sided back pain, leg pain and leg weakness x1 day.  Pt admitted with acute on chronic lumbar stenosis and acute ambulation dysfunction.   Pt sitting in recliner char at time of visit. She did not arouse to voice or touch.   Pt is currently on a heart healthy diet. Observed lunch tray on tray table; pt consumed 100% of meal.    Reviewed wt hx; pt has experienced a 8.6% wt loss over the past 3 months. While this is not significant for time frame, it is concerning given pt's advanced age and multiple co-morbidities.   Per TOC notes, plan to d/c to SNF ([facility]) once medically stable.   Medications reviewed and include decadron and sodium chloride infusion @ 50 ml/hr.   Labs reviewed.   NUTRITION - FOCUSED PHYSICAL EXAM:  Flowsheet Row Most Recent Value  Orbital Region No depletion  Upper Arm Region Mild depletion  Thoracic and Lumbar Region No depletion  Buccal Region No depletion  Temple Region No depletion  Clavicle Bone Region No depletion  Clavicle and Acromion Bone Region No depletion  Scapular Bone Region No depletion  Dorsal Hand Mild depletion  Patellar Region Mild depletion  Anterior Thigh Region Mild depletion   Posterior Calf Region Mild depletion  Edema (RD Assessment) None  Hair Reviewed  Eyes Reviewed  Mouth Reviewed  Skin Reviewed  Nails Reviewed       Diet Order:   Diet Order             Diet Heart Room service appropriate? Yes; Fluid consistency: Thin  Diet effective now                   EDUCATION NEEDS:   No education needs have been identified at this time  Skin:  Skin Assessment: Reviewed RN Assessment  Last BM:  03/07/22  Height:   Ht Readings from Last 1 Encounters:  03/09/22 '5\' 1"'$  (1.549 m)    Weight:   Wt Readings from Last 1 Encounters:  03/09/22 62 kg    Ideal Body Weight:  47.7 kg  BMI:  Body mass index is 25.83 kg/m.  Estimated Nutritional Needs:   Kcal:  1650-1850  Protein:  80-95 grams  Fluid:  > 1.6 L    Loistine Chance, RD, LDN, [place] Registered Dietitian II Certified Diabetes Care and Education Specialist Please refer to [facility] for RD and/or RD on-call/weekend/after hours pager

## 2022-03-10 NOTE — Care Management Obs Status (Signed)
[place] NOTIFICATION   Patient Details  Name: Victoria Prince MRN: [id number] Date of Birth: Jul 20, 1928   Medicare Observation Status Notification Given:  Yes    Beverly Sessions, RN 03/10/2022, 12:51 PM

## 2022-03-10 NOTE — Progress Notes (Addendum)
Physical Therapy Treatment Patient Details Name: MADELIN WESEMAN MRN: [id number] DOB: [birth year] Today's Date: 03/10/2022   History of Present Illness ELAH AVELLINO is a 88 y.o. 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    PT Comments    Pt received seated upright in bed with son in room. Pt agreeable to PT intervention. Denies N/V today with lessened pain levels today in low back and RLE. PT tolerates LE therex well with some mild difficulty with RLE exercises due to pain and weakness from radiculopathy. Most notable focal weakness with ankle DF on RLE that is effortful and with reduced AROM. Pt demonstrates mod-I bed mobility but does not follow log roll technique. X2 STS performed with supervision and VC's for hand placement. Pt able to stand with Improved WB on RLE with less hip abduction compared to eval. Pt endorses moderate dizziness with standing. On second STS attempt noted positive orthostatic vitals from sitting to standing as listed below. Deferred gait at this time due to falls risk due to low BP. Son and pt updated. MD made aware. PT to re-attempt in p.m. in hopes with improvement in orthostatics post fluids. Pt's son updated on d/c options at this time b/t STR versus home with [facility] PT and versus home with OPPT. Educated son on current recs for STR as pt unable to perform gait due to other medical issues. Pt left in care of TOC and son In supine with all needs in reach.   Seated vitals: 137/69 mm Hg, HR: 83 BPM Standing vitals: 113/67 mm Hg, HR: 86 BPM   Recommendations for follow up therapy are one component of a multi-disciplinary discharge planning process, led by the attending physician.  Recommendations may be updated based on patient status, additional functional criteria and insurance  authorization.  Follow Up Recommendations  Skilled nursing-short term rehab (<3 hours/day) Can patient physically be transported by private vehicle: No   Assistance Recommended at Discharge Intermittent Supervision/Assistance  Patient can return home with the following A lot of help with walking and/or transfers;A lot of help with bathing/dressing/bathroom;Assist for transportation;Help with stairs or ramp for entrance   Equipment Recommendations  None recommended by PT    Recommendations for Other Services       Precautions / Restrictions Precautions Precautions: Fall Precaution Booklet Issued: No Restrictions Weight Bearing Restrictions: No     Mobility  Bed Mobility Overal bed mobility: Modified Independent             General bed mobility comments: increased time. Education on log roll technique. Patient Response: Cooperative  Transfers Overall transfer level: Needs assistance Equipment used: Rolling walker (2 wheels) Transfers: Sit to/from Stand Sit to Stand: Supervision           General transfer comment: Stands witha hands pushing off of bed with VC's.    Ambulation/Gait               General Gait Details: deferred due to orthostatic hypotension   Stairs             Wheelchair Mobility    Modified Rankin (Stroke Patients Only)       Balance Overall balance assessment: Needs assistance Sitting-balance support: Bilateral upper extremity supported, Feet supported Sitting balance-Leahy Scale: Good       Standing balance-Leahy Scale: Poor Standing balance comment:  reliant on AD to stand                            Cognition Arousal/Alertness: Awake/alert Behavior During Therapy: WFL for tasks assessed/performed Overall Cognitive Status: Within Functional Limits for tasks assessed                                          Exercises General Exercises - Lower Extremity Ankle Circles/Pumps: AROM,  Strengthening, Both, 10 reps, Supine Short Arc Quad: AROM, Strengthening, Both, 10 reps, Supine Hip ABduction/ADduction: AROM, Strengthening, Supine, Both, 10 reps Hip Flexion/Marching: AROM, Strengthening, Right, 5 reps, Standing    General Comments        Pertinent Vitals/Pain Pain Assessment Pain Assessment: Faces Faces Pain Scale: Hurts little more Pain Location: low back and RLE Pain Descriptors / Indicators: Sharp, Shooting, Grimacing Pain Intervention(s): Limited activity within patient's tolerance, Monitored during session, Repositioned    Home Living                          Prior Function            PT Goals (current goals can now be found in the care plan section) Acute Rehab PT Goals Patient Stated Goal: improve pain PT Goal Formulation: With patient Time For Goal Achievement: 03/23/22 Potential to Achieve Goals: Fair Progress towards PT goals: Progressing toward goals    Frequency    Min 2X/week      PT Plan Current plan remains appropriate    Co-evaluation              AM-PAC PT "6 Clicks" Mobility   Outcome Measure  Help needed turning from your back to your side while in a flat bed without using bedrails?: A Little Help needed moving from lying on your back to sitting on the side of a flat bed without using bedrails?: A Little Help needed moving to and from a bed to a chair (including a wheelchair)?: A Lot Help needed standing up from a chair using your arms (e.g., wheelchair or bedside chair)?: A Lot Help needed to walk in hospital room?: Total Help needed climbing 3-5 steps with a railing? : Total 6 Click Score: 12    End of Session Equipment Utilized During Treatment: Gait belt Activity Tolerance: Treatment limited secondary to medical complications (Comment) (orthostatic) Patient left: in bed;with call bell/phone within reach;with bed alarm set;with family/visitor present Nurse Communication: Mobility status PT Visit  Diagnosis: Other abnormalities of gait and mobility (R26.89);Muscle weakness (generalized) (M62.81);Pain Pain - Right/Left: Right Pain - part of body: Leg     Time: 1028-1100 PT Time Calculation (min) (ACUTE ONLY): 32 min  Charges:  $Therapeutic Exercise: 8-22 mins $Therapeutic Activity: 8-22 mins                    Saraann Enneking M. Fairly IV, PT, DPT Physical Therapist- [facility]  03/10/2022, 12:34 PM

## 2022-03-10 NOTE — TOC Initial Note (Signed)
Transition of Care [facility]) - Initial/Assessment Note    Patient Details  Name: Victoria Prince MRN: [id number] Date of Birth: 1928/10/17  Transition of Care [facility]) CM/SW Contact:    Beverly Sessions, RN Phone Number: 03/10/2022, 12:45 PM  Clinical Narrative:                  Admitted ALP:[id number] back pain Admitted from: home with son HGD:[id number] [facility] Current home health/prior home health/DME: RW, cane, crutches, WC, and BSC  Therapy recommending SNF.  Patient and son agreeable to bed search Exiting PASSR Bed search initiated [place] sent for signature   Expected Discharge Plan: Skilled Nursing Facility Barriers to Discharge: Continued Medical Work up   Patient Goals and CMS Choice Patient states their goals for this hospitalization and ongoing recovery are:: to go hom CMS Medicare.gov Compare Post Acute Care list provided to:: Patient Choice offered to / list presented to : Patient, Adult Children  Expected Discharge Plan and Services Expected Discharge Plan: [place]                                              Prior Living Arrangements/Services       Do you feel safe going back to the place where you live?: Yes            Criminal Activity/Legal Involvement Pertinent to Current Situation/Hospitalization: No - Comment as needed  Activities of Daily Living Home Assistive Devices/Equipment: Wheelchair, Crutches, Grab bars around toilet ADL Screening (condition at time of admission) Patient's cognitive ability adequate to safely complete daily activities?: Yes Is the patient deaf or have difficulty hearing?: Yes Does the patient have difficulty seeing, even when wearing glasses/contacts?: No Does the patient have difficulty concentrating, remembering, or making decisions?: Yes Patient able to express need for assistance with ADLs?: Yes Does the patient have difficulty dressing or bathing?: No Independently performs ADLs?:  Yes (appropriate for developmental age) Does the patient have difficulty walking or climbing stairs?: Yes Weakness of Legs: Both Weakness of Arms/Hands: None  Permission Sought/Granted                  Emotional Assessment              Admission diagnosis:  Intractable back pain [M54.9] Impaired ambulation [R26.2] Patient Active Problem List   Diagnosis Date Noted   Intractable back pain 03/09/2022   Impaired ambulation 03/09/2022   Candida infection 12/13/2019   Age-related osteoporosis without current pathological fracture 01/15/2019   Carcinoma of upper-outer quadrant of left breast in female, estrogen receptor positive ([place]) 01/20/2016   Cancer of breast ([place]) 01/12/2015   H/O total hip arthroplasty 11/11/2014   Neuritis or radiculitis due to rupture of lumbar intervertebral disc 01/10/2014   DDD (degenerative disc disease), lumbar 01/10/2014   Lumbar canal stenosis 01/10/2014   PCP:  Sofie Hartigan, MD Pharmacy:   [id number] [place] [place] [employer] [address] [address]Phone: [phone]: [phone]    Social Determinants of Health (SDOH) Interventions Housing Interventions: Intervention Not Indicated  Readmission Risk Interventions     No data to display

## 2022-03-11 DIAGNOSIS — M549 Dorsalgia, unspecified: Secondary | ICD-10-CM | POA: Diagnosis not present

## 2022-03-11 LAB — BASIC METABOLIC PANEL
Anion gap: 6 (ref 5–15)
BUN: 29 mg/dL — ABNORMAL HIGH (ref 8–23)
CO2: 26 mmol/L (ref 22–32)
Calcium: 9.4 mg/dL (ref 8.9–10.3)
Chloride: 107 mmol/L (ref 98–111)
Creatinine, Ser: 1.06 mg/dL — ABNORMAL HIGH (ref 0.44–1.00)
GFR, Estimated: 50 mL/min — ABNORMAL LOW (ref >60)
Glucose, Bld: 124 mg/dL — ABNORMAL HIGH (ref 70–99)
Potassium: 4.7 mmol/L (ref 3.5–5.1)
Sodium: 139 mmol/L (ref 135–145)

## 2022-03-11 LAB — CBC
HCT: 39.4 % (ref 36.0–46.0)
Hemoglobin: 12.6 g/dL (ref 12.0–15.0)
MCH: 31 pg (ref 26.0–34.0)
MCHC: 32 g/dL (ref 30.0–36.0)
MCV: 96.8 fL (ref 80.0–100.0)
Platelets: 212 10*3/uL (ref 150–400)
RBC: 4.07 MIL/uL (ref 3.87–5.11)
RDW: 13.4 % (ref 11.5–15.5)
WBC: 7.3 10*3/uL (ref 4.0–10.5)
nRBC: 0 % (ref 0.0–0.2)

## 2022-03-11 LAB — MAGNESIUM: Magnesium: 2.5 mg/dL — ABNORMAL HIGH (ref 1.7–2.4)

## 2022-03-11 MED ORDER — PREGABALIN 25 MG PO CAPS
25.0000 mg | ORAL_CAPSULE | Freq: Every day | ORAL | Status: DC
  Administered 2022-03-11: 25 mg via ORAL
  Filled 2022-03-11: qty 1

## 2022-03-11 MED ORDER — TRAZODONE HCL 50 MG PO TABS
25.0000 mg | ORAL_TABLET | Freq: Every evening | ORAL | Status: DC | PRN
  Administered 2022-03-11: 25 mg via ORAL
  Filled 2022-03-11: qty 1

## 2022-03-11 MED ORDER — ENOXAPARIN SODIUM 30 MG/0.3ML IJ SOSY
30.0000 mg | PREFILLED_SYRINGE | INTRAMUSCULAR | Status: DC
  Administered 2022-03-11: 30 mg via SUBCUTANEOUS
  Filled 2022-03-11: qty 0.3

## 2022-03-11 NOTE — Progress Notes (Signed)
PHARMACIST - PHYSICIAN COMMUNICATION  CONCERNING:  Enoxaparin (Lovenox) for DVT Prophylaxis    RECOMMENDATION: Patient was prescribed enoxaprin '40mg'$  q24 hours for VTE prophylaxis.   Filed Weights   03/08/22 1947 03/09/22 1550  Weight: 66.2 kg (146 lb) 62 kg (136 lb 11 oz)    Body mass index is 25.83 kg/m.  Estimated Creatinine Clearance: 29.2 mL/min (A) (by C-G formula based on SCr of 1.06 mg/dL (H)).   Patient is candidate for enoxaparin '30mg'$  every 24 hours based on CrCl <28m/min  DESCRIPTION: [facility]  Patient is now receiving enoxaparin 30 mg every 24 hours   WDara Hoyer PharmD PGY-1 Pharmacy Resident 03/11/2022 7:39 AM

## 2022-03-11 NOTE — Progress Notes (Signed)
[place] at [place] NAME: Victoria Prince    MR#:  [id number]  DATE OF BIRTH:  04-03-1929  SUBJECTIVE:  complains of pain during the nighttime. Patient stated she had difficulty sleeping however did not ask for any pain meds at night. Son at bedside. Sitting up to eat lunch earlier. Complained of dizziness yesterday while working with PT.   VITALS:  Blood pressure (!) 134/49, pulse 70, temperature 97.9 F (36.6 C), temperature source Oral, resp. rate 15, height '5\' 1"'$  (1.549 m), weight 62 kg, SpO2 93 %.  PHYSICAL EXAMINATION:   GENERAL:  88 y.o.-year-old patient lying in the bed with no acute distress.  LUNGS: Normal breath sounds bilaterally, no wheezing, rales, rhonchi.  CARDIOVASCULAR: S1, S2 normal. No murmurs, rubs, or gallops.  ABDOMEN: Soft, nontender, nondistended. Bowel sounds present.  EXTREMITIES: No  edema b/l.    NEUROLOGIC: nonfocal  patient is alert and awake SKIN: No obvious rash, lesion, or ulcer.   LABORATORY PANEL:  CBC Recent Labs  Lab 03/11/22 0416  WBC 7.3  HGB 12.6  HCT 39.4  PLT 212    Chemistries  Recent Labs  Lab 03/11/22 0416  NA 139  K 4.7  CL 107  CO2 26  GLUCOSE 124*  BUN 29*  CREATININE 1.06*  CALCIUM 9.4  MG 2.5*    Assessment and Plan  Victoria Prince is a 88 y.o. female with medical history significant of HTN, lumbar stenosis with sciatica, breast cancer status post lumpectomy radiation and chemotherapy, presented with right-sided back pain, leg pain and leg weakness x1 day.   Patient has residual sciatica, chronic back pain and radiating to the right leg, she underwent lumbar injection 8 years ago and has had some relief.  Sunday morning, patient woke up with severe recurrent lower back pain radiating to right side of leg and right foot with numbness of right foot, whole day yesterday she has been trouble putting weight on the right leg and foot because of the pain.  Denies any  urine or bowel movement problems no fever or chills.  She took some as needed naproxen with minimal relief.   Neurogenic claudication  Severe right-sided lumbar radiculopathy --resulting in ambulatory dysfunction --Given the MRI finding of questions acute radiculitis, started on steroid Decadron 4 mg twice daily --cont pain control, added lyrica --cont decadron for now --Neurosurgery consult with Dr. Cari Caraway appreciated. Recommends conservative management with pain meds and physical therapy. --PT/OT-- recommends rehab   HTN -Continue amlodipine -- patient had mild orthostasis. Will continue to monitor.   HLD -Continue pravastatin   CKD stage 3a --recent GFR indicated stage 3a   Remote history of breast cancer -No acute concerns.   Borderline abnormal endometrial thickening  --consider outpatient ObGyn f/u -- defer to PCP and family to discuss with as outpatient -- discuss results with patient's son and acknowledged.     DVT prophylaxis: Lovenox SQ Code Status: Full code  Family Communication: son updated at bedside today Level of care: Med-Surg Dispo:   The patient is from: home Anticipated d/c is to: SNF rehab once insurance authorization available       TOTAL TIME TAKING CARE OF THIS PATIENT: 35 minutes.  >50% time spent on counselling and coordination of care  Note: This dictation was prepared with Dragon dictation along with smaller phrase technology. Any transcriptional errors that result from this process are unintentional.  Fritzi Mandes M.D    [facility]  CC: Primary care physician; Sofie Hartigan, MD

## 2022-03-11 NOTE — Progress Notes (Signed)
Physical Therapy Treatment Patient Details Name: Victoria Prince MRN: [id number] DOB: 06-11-1929 Today's Date: 03/11/2022   History of Present Illness Victoria Prince is a 88 y.o. 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    PT Comments    Pt received upright in bed agreeable to PT. Remains limited in progressing gait as pt remains orthostatic. Able to transfer to sitting EOB mod-I and performing x2STS with minguard. On second STS pt performing step pivot transfer with safe use of RW.  Pt is improving in mobility with increased Wb'ing on RLE but is overall very limited due to moderate dizziness with positional changes thus warranting STR placement. PT in recliner with all needs in reach. RN and MD updated.    Recommendations for follow up therapy are one component of a multi-disciplinary discharge planning process, led by the attending physician.  Recommendations may be updated based on patient status, additional functional criteria and insurance authorization.  Follow Up Recommendations  Skilled nursing-short term rehab (<3 hours/day) Can patient physically be transported by private vehicle: No   Assistance Recommended at Discharge Intermittent Supervision/Assistance  Patient can return home with the following A lot of help with walking and/or transfers;A lot of help with bathing/dressing/bathroom;Assist for transportation;Help with stairs or ramp for entrance   Equipment Recommendations  None recommended by PT    Recommendations for Other Services       Precautions / Restrictions Precautions Precautions: Fall Precaution Booklet Issued: No Restrictions Weight Bearing Restrictions: No     Mobility  Bed Mobility Overal bed mobility: Modified Independent               Patient  Response: Cooperative  Transfers Overall transfer level: Needs assistance Equipment used: Rolling walker (2 wheels) Transfers: Bed to chair/wheelchair/BSC Sit to Stand: Supervision   Step pivot transfers: Min guard       General transfer comment: Stands witha hands pushing off of bed with VC's.    Ambulation/Gait               General Gait Details: unable due to dizziness   Stairs             Wheelchair Mobility    Modified Rankin (Stroke Patients Only)       Balance Overall balance assessment: Needs assistance Sitting-balance support: Bilateral upper extremity supported, Feet supported Sitting balance-Leahy Scale: Good       Standing balance-Leahy Scale: Poor Standing balance comment: reliant on AD to stand                            Cognition Arousal/Alertness: Awake/alert Behavior During Therapy: WFL for tasks assessed/performed Overall Cognitive Status: Within Functional Limits for tasks assessed                                          Exercises      General Comments        Pertinent Vitals/Pain Pain Assessment Pain Assessment: Faces Faces Pain Scale: Hurts a little bit Pain Location: low back and RLE Pain Descriptors / Indicators: Sharp, Shooting, Grimacing Pain Intervention(s): Limited activity within patient's tolerance, Monitored during session, Repositioned    Home Living  Prior Function            PT Goals (current goals can now be found in the care plan section) Acute Rehab PT Goals Patient Stated Goal: improve pain PT Goal Formulation: With patient Time For Goal Achievement: 03/23/22 Potential to Achieve Goals: Fair Progress towards PT goals: Progressing toward goals    Frequency    Min 2X/week      PT Plan Current plan remains appropriate    Co-evaluation              AM-PAC PT "6 Clicks" Mobility   Outcome Measure  Help needed  turning from your back to your side while in a flat bed without using bedrails?: A Little Help needed moving from lying on your back to sitting on the side of a flat bed without using bedrails?: A Little Help needed moving to and from a bed to a chair (including a wheelchair)?: A Little Help needed standing up from a chair using your arms (e.g., wheelchair or bedside chair)?: A Little Help needed to walk in hospital room?: Total Help needed climbing 3-5 steps with a railing? : Total 6 Click Score: 14    End of Session Equipment Utilized During Treatment: Gait belt Activity Tolerance: Treatment limited secondary to medical complications (Comment) Patient left: with call bell/phone within reach;with family/visitor present;in chair;with chair alarm set Nurse Communication: Mobility status PT Visit Diagnosis: Other abnormalities of gait and mobility (R26.89);Muscle weakness (generalized) (M62.81);Pain Pain - Right/Left: Right Pain - part of body: Leg     Time: 1346-1406 PT Time Calculation (min) (ACUTE ONLY): 20 min  Charges:  $Therapeutic Activity: 8-22 mins                     Salem Caster. Fairly IV, PT, DPT Physical Therapist- [facility]  03/11/2022, 4:09 PM

## 2022-03-11 NOTE — TOC Progression Note (Signed)
Transition of Care [facility]) - Progression Note    Patient Details  Name: Victoria Prince MRN: [id number] Date of Birth: 24-Aug-1928  [facility]) CM/SW Contact  Beverly Sessions, RN Phone Number: 03/11/2022, 3:46 PM  Clinical Narrative:     Per Earnest Bailey with TOC Intent to deny with option for peer to peer.  Number to call: [phone] opt 3.  Deadline is 9/21 at 12pm.  Number for expedited appeal: [phone] MD updated   Expected Discharge Plan: [place] Barriers to Discharge: Continued Medical Work up  Expected Discharge Plan and Services Expected Discharge Plan: [place]                                               Social Determinants of Health (SDOH) Interventions Housing Interventions: Intervention Not Indicated  Readmission Risk Interventions     No data to display

## 2022-03-11 NOTE — TOC Progression Note (Signed)
Transition of Care [facility]) - Progression Note    Patient Details  Name: Victoria Prince MRN: [id number] Date of Birth: Jul 19, 1928  Transition of Care [facility]) CM/SW Contact  Beverly Sessions, RN Phone Number: 03/11/2022, 2:43 PM  Clinical Narrative:    Josem Kaufmann still pending for SNF.  Son updated at bedside    Expected Discharge Plan: [place] Barriers to Discharge: Continued Medical Work up  Expected Discharge Plan and Services Expected Discharge Plan: [place]                                               Social Determinants of Health (SDOH) Interventions Housing Interventions: Intervention Not Indicated  Readmission Risk Interventions     No data to display

## 2022-03-12 DIAGNOSIS — M5416 Radiculopathy, lumbar region: Secondary | ICD-10-CM | POA: Diagnosis not present

## 2022-03-12 DIAGNOSIS — M48061 Spinal stenosis, lumbar region without neurogenic claudication: Secondary | ICD-10-CM

## 2022-03-12 DIAGNOSIS — M48062 Spinal stenosis, lumbar region with neurogenic claudication: Secondary | ICD-10-CM | POA: Diagnosis not present

## 2022-03-12 DIAGNOSIS — M549 Dorsalgia, unspecified: Secondary | ICD-10-CM | POA: Diagnosis not present

## 2022-03-12 MED ORDER — OXYCODONE HCL 5 MG PO TABS: 5.0000 mg | ORAL_TABLET | Freq: Four times a day (QID) | ORAL | 0 refills | Status: DC | PRN

## 2022-03-12 MED ORDER — SENNOSIDES-DOCUSATE SODIUM 8.6-50 MG PO TABS: 1.0000 | ORAL_TABLET | Freq: Every evening | ORAL | 0 refills | Status: AC | PRN

## 2022-03-12 MED ORDER — PREGABALIN 25 MG PO CAPS: 25.0000 mg | ORAL_CAPSULE | Freq: Every day | ORAL | 1 refills | Status: AC

## 2022-03-12 MED ORDER — DEXAMETHASONE 4 MG PO TABS: 4.0000 mg | ORAL_TABLET | Freq: Every day | ORAL | 0 refills | Status: AC

## 2022-03-12 NOTE — Progress Notes (Signed)
Physical Therapy Treatment Patient Details Name: Victoria Prince MRN: [id number] DOB: 16-May-1929 Today's Date: 03/12/2022   History of Present Illness Victoria Prince is a 88 y.o. 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    PT Comments    The pt presents this session with improved tolerance for standing ADL activity. She demonstrates improved independence with transfers, however continues to demonstrate increased fall risk with ambulation. At this time the pt is able to tolerate static standing x10 min for ADLs, however with increased pain with WB she is unable to participate in gait training even with seated rest break. The pt demonstrates folding over in pain with gait training. It is expected that if the pt were to d/c home at this level she would be at increased risk of falling d/t inability to continue ambulation without assistance to safely sit to surface. This increased risk of falling would also increase her risk of readmission. PT continues to recommend SNF in order to optimize tolerance to WB and improve independence with gait.     Recommendations for follow up therapy are one component of a multi-disciplinary discharge planning process, led by the attending physician.  Recommendations may be updated based on patient status, additional functional criteria and insurance authorization.  Follow Up Recommendations  Skilled nursing-short term rehab (<3 hours/day) Can patient physically be transported by private vehicle: Yes   Assistance Recommended at Discharge Intermittent Supervision/Assistance  Patient can return home with the following A lot of help with walking and/or transfers;Assist for transportation;Help with stairs or ramp for entrance;A little help with bathing/dressing/bathroom    Equipment Recommendations  None recommended by PT    Recommendations for Other Services       Precautions / Restrictions Precautions Precautions: Fall Precaution Booklet Issued: No Restrictions Weight Bearing Restrictions: No     Mobility  Bed Mobility Overal bed mobility: Modified Independent Bed Mobility: Sit to Supine       Sit to supine: Modified independent (Device/Increase time), HOB elevated   General bed mobility comments: Educated pt to begin not using the bed features to better simulate home environment.    Transfers Overall transfer level: Needs assistance Equipment used: Rolling walker (2 wheels) Transfers: Sit to/from Stand Sit to Stand: Supervision                Ambulation/Gait Ambulation/Gait assistance: Min assist Gait Distance (Feet): 5 Feet Assistive device: Rolling walker (2 wheels) Gait Pattern/deviations: Decreased stance time - right, Step-to pattern, Decreased step length - left           Stairs             Wheelchair Mobility    Modified Rankin (Stroke Patients Only)       Balance   Sitting-balance support: Feet unsupported, No upper extremity supported Sitting balance-Leahy Scale: Good     Standing balance support: Bilateral upper extremity supported Standing balance-Leahy Scale: Fair Standing balance comment: Pt able to stand at sink with BUE assistance with fair balance. During functional activity the pt is reliant on AD for balance and demonstrates poor balance.                            Cognition Arousal/Alertness: Awake/alert Behavior During Therapy: WFL for tasks assessed/performed Overall Cognitive Status: Within Functional  Limits for tasks assessed                                          Exercises      General Comments        Pertinent Vitals/Pain Pain Assessment Pain Assessment: 0-10 Pain Score: 6  Pain Location: RLE- increased with extended static standing  at sink for ADLs. Grimancing only noticed after trialing ambulation. Pain Descriptors / Indicators: Sharp, Shooting, Grimacing Pain Intervention(s): Limited activity within patient's tolerance    Home Living                          Prior Function            PT Goals (current goals can now be found in the care plan section) Acute Rehab PT Goals Patient Stated Goal: improve pain PT Goal Formulation: With patient Time For Goal Achievement: 03/23/22 Potential to Achieve Goals: Fair Progress towards PT goals: Progressing toward goals    Frequency    Min 2X/week      PT Plan Current plan remains appropriate    Co-evaluation              AM-PAC PT "6 Clicks" Mobility   Outcome Measure  Help needed turning from your back to your side while in a flat bed without using bedrails?: None Help needed moving from lying on your back to sitting on the side of a flat bed without using bedrails?: None Help needed moving to and from a bed to a chair (including a wheelchair)?: None Help needed standing up from a chair using your arms (e.g., wheelchair or bedside chair)?: A Little Help needed to walk in hospital room?: A Lot Help needed climbing 3-5 steps with a railing? : Total 6 Click Score: 18    End of Session   Activity Tolerance: Patient limited by pain Patient left: in bed;with call bell/phone within reach;with family/visitor present Nurse Communication: Mobility status PT Visit Diagnosis: Other abnormalities of gait and mobility (R26.89);Muscle weakness (generalized) (M62.81);Pain Pain - Right/Left: Right Pain - part of body: Leg     Time: 8242-3536 PT Time Calculation (min) (ACUTE ONLY): 17 min  Charges:  $Gait Training: 8-22 mins                     12:00 PM, 03/12/22 Lenn Volker A. Saverio Danker PT, DPT Physical Therapist - [facility]    Amauri Medellin A Denisha Hoel 03/12/2022, 11:54 AM

## 2022-03-12 NOTE — Care Management Important Message (Signed)
Important Message  Patient Details  Name: Victoria Prince MRN: [id number] Date of Birth: 12-Nov-1928   Medicare Important Message Given:  N/A - LOS <3 / Initial given by admissions     Dannette Barbara 03/12/2022, 2:15 PM

## 2022-03-12 NOTE — TOC Progression Note (Signed)
Transition of Care [facility]) - Progression Note    Patient Details  Name: Victoria Prince MRN: [id number] Date of Birth: 04-19-1929  [facility]) CM/SW Contact  Beverly Sessions, RN Phone Number: 03/12/2022, 9:05 AM  Clinical Narrative:    MD has called for peer to peer.  She is awaiting a return call from the MD   Expected Discharge Plan: [place] Barriers to Discharge: Continued Medical Work up  Expected Discharge Plan and Services Expected Discharge Plan: [place]                                               Social Determinants of Health (SDOH) Interventions Housing Interventions: Intervention Not Indicated  Readmission Risk Interventions     No data to display

## 2022-03-12 NOTE — Progress Notes (Signed)
Pt discharged per MD order. Transported via EMS to [facility]. Bedside RN called report to [facility]

## 2022-03-12 NOTE — TOC Transition Note (Signed)
Transition of Care [facility]) - CM/SW Discharge Note   Patient Details  Name: Victoria Prince MRN: [id number] Date of Birth: 10-Oct-1928  Transition of Care [facility]) CM/SW Contact:  Beverly Sessions, RN Phone Number: 03/12/2022, 1:15 PM   Clinical Narrative:      Notified that Blair Hailey has approved SNF stay Approval information provided to Gina at [facility]  Patient will DC [facility] Room 611B Anticipated DC date: 03/12/22  Family notified: Vm left for son Gaffer byJohnanna Schneiders  Per MD patient ready for DC to . RN, patient, patient's family, and facility notified of DC. Discharge Summary sent to facility. RN given number for report. DC packet on chart. Ambulance transport requested for patient.  TOC signing off.  Isaias Cowman [facility] [phone]      Barriers to Discharge: Continued Medical Work up   Patient Goals and CMS Choice Patient states their goals for this hospitalization and ongoing recovery are:: to go hom CMS Medicare.gov Compare Post Acute Care list provided to:: Patient Choice offered to / list presented to : Patient, Adult Children  Discharge Placement                       Discharge Plan and Services                                     Social Determinants of Health (SDOH) Interventions Housing Interventions: Intervention Not Indicated   Readmission Risk Interventions     No data to display

## 2022-03-12 NOTE — Discharge Summary (Signed)
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 female with medical history significant of HTN, lumbar stenosis with sciatica, breast cancer status post lumpectomy radiation and chemotherapy, presented with right-sided back pain, leg pain and leg weakness x1 day.   Patient has residual sciatica, chronic back pain and radiating to the right leg, she underwent lumbar injection 8 years ago and has had some relief.  "Sunday morning, patient woke up with severe recurrent lower back pain radiating to right side of leg and right foot with numbness of right foot, whole day yesterday she has been trouble putting weight on the right leg and foot because of the pain.  Denies any urine or bowel movement problems no fever or chills.  She took some as needed naproxen with minimal relief.   Neurogenic claudication  Severe right-sided lumbar radiculopathy --resulting in ambulatory dysfunction --Given the MRI finding of questions acute radiculitis, started on steroid Decadron 4 mg twice daily--change to po for 3-4 days --cont pain control, added lyrica --Neurosurgery consult with Dr. Yarborough appreciated. Recommends conservative management with pain meds and physical therapy. --PT/OT-- recommends rehab   HTN -Continue amlodipine -- patient had mild orthostasis. Will continue to monitor.   HLD -Continue pravastatin   CKD stage 3a --recent GFR indicated stage 3a   Remote history of breast cancer -No acute concerns.   Borderline  abnormal endometrial thickening  --consider outpatient ObGyn f/u -- defer to PCP and family to discuss with as outpatient -- discuss results with patient's son and acknowledged.     DVT prophylaxis: Lovenox SQ Code Status: Full code  Family Communication: son updated at bedside today Level of care: Med-Surg Dispo:   The patient is from: home Anticipated d/c is to: SNF rehab today      Pain control - [place] Controlled Substance Reporting System database was reviewed. and patient was instructed, not to drive, operate heavy machinery, perform activities at heights, swimming or participation in water activities or provide baby-sitting services while on Pain, Sleep and Anxiety Medications; until their outpatient Physician has advised to do so again. Also recommended to not to take more than prescribed Pain, Sleep and Anxiety Medications.  Consultants: neurosurgery Procedures performed: none Disposition: Rehabilitation facility Diet recommendation:  Discharge Diet Orders (From admission, onward)     Start     Ordered   03/12/22 0000  Diet - low sodium heart healthy        09"$ /21/23 1242           Cardiac diet DISCHARGE MEDICATION: Allergies as of 03/12/2022       Reactions   Sulfa Antibiotics Rash        Medication List     STOP taking these medications    alendronate 70 MG tablet Commonly known as: Fosamax   azithromycin 250 MG tablet Commonly known as: Zithromax Z-Pak   benzonatate 200 MG capsule Commonly known as: TESSALON   clobetasol cream 0.05 % Commonly known as: TEMOVATE   levocetirizine 5 MG tablet  Commonly known as: XYZAL       TAKE these medications    amitriptyline 25 MG tablet Commonly known as: ELAVIL Take 25 mg by mouth at bedtime.   amLODipine 2.5 MG tablet Commonly known as: NORVASC Take 2.5 mg by mouth daily.   cetirizine 10 MG tablet Commonly known as: ZYRTEC Take 1 tablet by mouth daily.   naproxen sodium 220 MG  tablet Commonly known as: ALEVE Take 220 mg by mouth daily as needed (pain).   oxyCODONE 5 MG immediate release tablet Commonly known as: Oxy IR/ROXICODONE Take 1 tablet (5 mg total) by mouth every 6 (six) hours as needed for moderate pain.   pravastatin 40 MG tablet Commonly known as: PRAVACHOL Take 40 mg by mouth daily.   pregabalin 25 MG capsule Commonly known as: LYRICA Take 1 capsule (25 mg total) by mouth at bedtime.   senna-docusate 8.6-50 MG tablet Commonly known as: Senokot-S Take 1 tablet by mouth at bedtime as needed for mild constipation.   triamcinolone cream 0.5 % Commonly known as: KENALOG Apply topically as directed.        Contact information for follow-up providers     Feldpausch, Chrissie Noa, MD. Schedule an appointment as soon as possible for a visit in 1 week(s).   Specialty: Family Medicine Why: hospital f/u Contact information: [place] [address] [phone]              Contact information for after-discharge care     Destination     [facility] Preferred SNF .   Service: Skilled Nursing Contact information: [address] [place] [phone]                    Discharge Exam: Danley Danker Weights   03/08/22 1947 03/09/22 1550  Weight: 66.2 kg 62 kg     Condition at discharge: fair  The results of significant diagnostics from this hospitalization (including imaging, microbiology, ancillary and laboratory) are listed below for reference.   Imaging Studies: MR LUMBAR SPINE WO CONTRAST  Result Date: 03/09/2022 CLINICAL DATA:  88 year old female with back pain radiating to the right hip and leg. No known injury. EXAM: MRI LUMBAR SPINE WITHOUT CONTRAST TECHNIQUE: Multiplanar, multisequence MR imaging of the lumbar spine was performed. No intravenous contrast was administered. COMPARISON:  Lumbar MRI 11/28/2013. FINDINGS: Segmentation: Lumbar segmentation appears to be  normal and will be designated as such for this report. This is the same numbering system used on the 2015 MRI. Alignment: Chronically preserved lumbar lordosis. Grade 1 anterolisthesis of L4 on L5 has increased since 2015, 4-5 mm now. See additional details of that level below. Mild dextroconvex lumbar scoliosis is chronic. Normal alignment otherwise. Vertebrae: Visualized bone marrow signal is within normal limits. No marrow edema or evidence of acute osseous abnormality. Intact visible sacrum. Conus medullaris and cauda equina: Conus extends to the T12-L1 level. No lower spinal cord or conus signal abnormality. Capacious spinal canal above L4-L5. Generally normal cauda equina nerve roots. Paraspinal and other soft tissues: Benign right renal cyst has regressed (no follow-up imaging recommended). Diverticulosis of the visible large bowel. Small round simple 1.9 cm right ovarian cyst is visible at the pelvic inlet series 8, image 33. 1.9 cm right ovarian simple-appearing cyst. No follow-up imaging is recommended. Reference: JACR 2020 Feb;17(2):248-254 However, the uterine endometrial stripe is also visible on series 5, image 8 and measures 8-9 mm (up to 8 mm is within normal  limits but 3 mm is average). Disc levels: Normal for age T11-T12 through L3-L4; mild to moderate facet and ligament flavum hypertrophy at those levels has increased since 2015. L4-L5: Grade 1 anterolisthesis with mild circumferential disc/pseudo disc. Severe facet and ligament flavum hypertrophy with progression since 2015. Degenerative facet joint fluid. Progressed and now severe spinal stenosis, maximal just below the disc space level (series 5, image 8 and series 8 image 30). Right greater than left lateral recess involvement (L5 nerve levels). Only borderline to mild L4 foraminal stenosis. L5-S1: Bulky chronic left far lateral disc osteophyte complex. Moderate to severe facet and ligament flavum hypertrophy with chronic facet joint fluid. No  spinal stenosis. Mild to moderate left and moderate to severe right lateral recess stenosis is greater on the right (right S1 nerve level) and mildly progressed since 2015 (series 5, image 6). No foraminal stenosis. IMPRESSION: 1. Increased chronic grade 1 anterolisthesis at L4-L5 along with severe facet arthropathy since 2015. Increased chronic spinal and lateral recess stenosis there, now severe. And severe posterior element degeneration also at L5-S1, with increased moderate to severe right lateral recess stenosis at that level. Query Right L5 and/or S1 radiculitis. 2. No acute osseous abnormality. And other lumbar levels are normal for age. 3. Borderline abnormal endometrial thickening for an asymptomatic postmenopausal patient. Endometrial sampling should be considered to exclude carcinoma. Electronically Signed   By: Genevie Ann M.D.   On: 03/09/2022 04:55   CT Hip Right Wo Contrast  Result Date: 03/09/2022 CLINICAL DATA:  Hip pain, stress fracture suspected. EXAM: CT OF THE RIGHT HIP WITHOUT CONTRAST TECHNIQUE: Multidetector CT imaging of the right hip was performed according to the standard protocol. Multiplanar CT image reconstructions were also generated. RADIATION DOSE REDUCTION: This exam was performed according to the departmental dose-optimization program which includes automated exposure control, adjustment of the mA and/or kV according to patient size and/or use of iterative reconstruction technique. COMPARISON:  Right hip x-ray 03/08/2022. FINDINGS: Bones/Joint/Cartilage No evidence for fracture. No focal osseous lesion. No significant joint effusion. Ligaments Suboptimally assessed by CT. Muscles and Tendons Within normal limits. Soft tissues There is subcutaneous stranding and small amount of hyperdensity in the inferior right gluteal region which may represent a small amount of hematoma. No fluid collection. IMPRESSION: 1. No acute fracture or dislocation of the right hip. 2. Small amount of  inferior right gluteal subcutaneous hyperdensity may represent hematoma. Correlate clinically. Electronically Signed   By: Ronney Asters M.D.   On: 03/09/2022 02:09   DG Hip Unilat W or Wo Pelvis 2-3 Views Right  Result Date: 03/08/2022 CLINICAL DATA:  Pain. EXAM: DG HIP (WITH OR WITHOUT PELVIS) 2-3V RIGHT COMPARISON:  None Available. FINDINGS: There is no evidence of hip fracture or dislocation. Left hip arthroplasty appears uncomplicated. There is no evidence of arthropathy or other focal bone abnormality. IMPRESSION: Negative. Electronically Signed   By: Ronney Asters M.D.   On: 03/08/2022 20:44   DG Knee Right Port  Result Date: 03/08/2022 CLINICAL DATA:  Right leg and hip pain, no known injury EXAM: PORTABLE RIGHT KNEE - 1-2 VIEW COMPARISON:  None Available. FINDINGS: No fracture or dislocation of the right knee. Severe tricompartmental joint space narrowing and osteophytosis. Small, nonspecific knee joint effusion. Soft tissues unremarkable. IMPRESSION: 1. No fracture or dislocation of the right knee. 2. Severe tricompartmental osteoarthritis. 3. Small, nonspecific right knee joint effusion. Electronically Signed   By: Delanna Ahmadi M.D.   On: 03/08/2022 20:34    Microbiology: Results  for orders placed or performed during the hospital encounter of 02/02/22  SARS Coronavirus 2 by RT PCR (hospital order, performed in [facility] hospital lab) *cepheid single result test* Anterior Nasal Swab     Status: None   Collection Time: 02/02/22  2:53 PM   Specimen: Anterior Nasal Swab  Result Value Ref Range Status   SARS Coronavirus 2 by RT PCR NEGATIVE NEGATIVE Final    Comment: (NOTE) SARS-CoV-2 target nucleic acids are NOT DETECTED.  The SARS-CoV-2 RNA is generally detectable in upper and lower respiratory specimens during the acute phase of infection. The lowest concentration of SARS-CoV-2 viral copies this assay can detect is 250 copies / mL. A negative result does not preclude SARS-CoV-2  infection and should not be used as the sole basis for treatment or other patient management decisions.  A negative result may occur with improper specimen collection / handling, submission of specimen other than nasopharyngeal swab, presence of viral mutation(s) within the areas targeted by this assay, and inadequate number of viral copies (<250 copies / mL). A negative result must be combined with clinical observations, patient history, and epidemiological information.  Fact Sheet for Patients:   https://www.[place].info/  Fact Sheet for Healthcare Providers: https://hall.com/  This test is not yet approved or  cleared by the [place] FDA and has been authorized for detection and/or diagnosis of SARS-CoV-2 by FDA under an Emergency Use Authorization (EUA).  This EUA will remain in effect (meaning this test can be used) for the duration of the COVID-19 declaration under Section 564(b)(1) of the Act, 21 U.S.C. section 360bbb-3(b)(1), unless the authorization is terminated or revoked sooner.  Performed at [facility], [address] [place], [place] [address]     Labs: CBC: Recent Labs  Lab 03/09/22 1017 03/11/22 0416  WBC 3.6* 7.3  HGB 13.4 12.6  HCT 41.4 39.4  MCV 96.5 96.8  PLT 193 976   Basic Metabolic Panel: Recent Labs  Lab 03/09/22 1017 03/11/22 0416  NA 139 139  K 4.3 4.7  CL 108 107  CO2 22 26  GLUCOSE 164* 124*  BUN 14 29*  CREATININE 1.06* 1.06*  CALCIUM 9.6 9.4  MG  --  2.5*   Liver Function Tests: No results for input(s): "AST", "ALT", "ALKPHOS", "BILITOT", "PROT", "ALBUMIN" in the last 168 hours. CBG: No results for input(s): "GLUCAP" in the last 168 hours.  Discharge time spent: greater than 30 minutes.  Signed: Fritzi Mandes, MD [facility] 03/12/2022

## 2022-03-18 NOTE — Progress Notes (Unsigned)
Referring Physician:  Sofie Hartigan, MD [address] [place],  [place] [id number]  Primary Physician:  Sofie Hartigan, MD  History of Present Illness: 03/19/2022 [name] is here today with a chief complaint of right leg pain.  [facility]  She is doing physical therapy.  She is not really any better than she was in the hospital.  03/10/2022 [name] is here today with a chief complaint of trouble walking due to back and RLE pain.  She reports that she has been having worsening pain and difficulty over the past several weeks to months.  She was previously ambulatory without assistance but is now having trouble walking more than a few steps without assistance.  She was having trouble putting weight on her right lower extremity due to pain.  Her son brought her to the hospital due to severe radiating pain in her right leg on the lateral aspect as well as difficulty walking.   Her son is also noted that she has had some minor confusion over the last 2 months or so.  She normally lives at home with her son and is able to take care of her affairs.  Review of Systems:  A 10 point review of systems is negative, except for the pertinent positives and negatives detailed in the HPI.  Past Medical History: Past Medical History:  Diagnosis Date   Breast cancer ([place]) 2011   left breast lumpectomy with rad and chemo tx   Personal history of chemotherapy 2011   left breast   Personal history of radiation therapy 2011   left breast   Radiation 2011   S/P chemotherapy, time since greater than 12 weeks 2011    Past Surgical History: Past Surgical History:  Procedure Laterality Date   BREAST BIOPSY Left 2012   negative   BREAST BIOPSY Left 2011   2 areas removed. Retroaerola area benign.  UOQ was DCIS with positive margin per Dr. Rogue Bussing notes.    BREAST BIOPSY Right 12/26/2019   [place] bx, Q marker, path pending   SKIN LESION EXCISION   06/2021    Allergies: Allergies as of 03/19/2022 - Review Complete 03/19/2022  Allergen Reaction Noted   Sulfa antibiotics Rash 01/08/2015    Medications: No outpatient medications have been marked as taking for the 03/19/22 encounter (Office Visit) with Meade Maw, MD.    Social History: Social History   Tobacco Use   Smoking status: Never   Smokeless tobacco: Never  Vaping Use   Vaping Use: Never used  Substance Use Topics   Alcohol use: Never   Drug use: Never    Family Medical History: Family History  Problem Relation Age of Onset   Breast cancer Neg Hx     Physical Examination: Vitals:   03/19/22 1054  BP: (!) 148/82    General: Patient is well developed, well nourished, calm, collected, and in no apparent distress. Attention to examination is appropriate.  Neck:   Supple.  Full range of motion.  Respiratory: Patient is breathing without any difficulty.   NEUROLOGICAL:     Awake, alert, oriented to person, place, and time.  Speech is clear and fluent. Fund of knowledge is appropriate.   Cranial Nerves: Pupils equal round and reactive to light.  Facial tone is symmetric.  Facial sensation is symmetric. Shoulder shrug is symmetric. Tongue protrusion is midline.    Side Iliopsoas Quads Hamstring PF DF EHL  R 5  $'5 5 5 5 5  'T$ L '5 5 5 5 5 5   '$ Reflexes are 1+ and symmetric at the biceps, triceps, brachioradialis, patella and achilles.   Hoffman's is absent.   Bilateral upper and lower extremity sensation is intact to light touch.    No evidence of dysmetria noted.  Gait is untested - in wheelchair.     Medical Decision Making  Imaging: MRI L spine 03/09/22 IMPRESSION: 1. Increased chronic grade 1 anterolisthesis at L4-L5 along with severe facet arthropathy since 2015. Increased chronic spinal and lateral recess stenosis there, now severe. And severe posterior element degeneration also at L5-S1, with increased moderate to severe right lateral  recess stenosis at that level. Query Right L5 and/or S1 radiculitis.   2. No acute osseous abnormality. And other lumbar levels are normal for age.   3. Borderline abnormal endometrial thickening for an asymptomatic postmenopausal patient. Endometrial sampling should be considered to exclude carcinoma.     Electronically Signed   By: Genevie Ann M.D.   On: 03/09/2022 04:55  I have personally reviewed the images and agree with the above interpretation.  Assessment and Plan: Victoria Prince is a pleasant 88 y.o. 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  She is currently doing physical therapy.  I think she should try an epidural steroid injection before consideration of surgery.  I will see her back in 6 weeks.  I will refer her for L4-5 versus L5-S1 epidural steroid injection.  We will continue PT in the meantime.  If she is not better at follow-up, she will need clearance from her primary care provider to consider surgery.  I reviewed with her and her son that recommendation of surgery is carefully considered for patient's at her age.  Thus, I have strongly recommended continuation of conservative management until that is exhausted.   I spent a total of 30 minutes in face-to-face and non-face-to-face activities related to this patient's care today.  Thank you for involving me in the care of this patient.      Riyah Bardon K. Izora Ribas MD, [facility] Neurosurgery

## 2022-03-19 ENCOUNTER — Ambulatory Visit: Payer: Medicare HMO | Admitting: Neurosurgery

## 2022-03-19 ENCOUNTER — Encounter: Payer: Self-pay | Admitting: Neurosurgery

## 2022-03-19 VITALS — BP 148/82 | Ht 61.0 in | Wt 136.0 lb

## 2022-03-19 DIAGNOSIS — M48062 Spinal stenosis, lumbar region with neurogenic claudication: Secondary | ICD-10-CM | POA: Diagnosis not present

## 2022-03-27 ENCOUNTER — Telehealth: Payer: Self-pay | Admitting: Primary Care

## 2022-03-27 NOTE — Telephone Encounter (Signed)
Spoke with son about scheduling the Palliative Consult and he said that patient is still at [employer] and is supposed to come home next week.  He would like to wait until she is home to schedule the Consult so I gave him our number and he said he would call us once she is home.

## 2022-04-02 ENCOUNTER — Telehealth: Payer: Self-pay

## 2022-04-02 NOTE — Telephone Encounter (Signed)
Attempted to contact patient's son Kasandra Knudsen to schedule a Palliative Care consult appointment. No answer left a message to return call.

## 2022-04-03 ENCOUNTER — Telehealth: Payer: Self-pay

## 2022-04-03 NOTE — Telephone Encounter (Signed)
Attempted to contact patient's son Kasandra Knudsen to schedule a Palliative Care consult appointment. No answer left a message to return call.

## 2022-04-06 ENCOUNTER — Telehealth: Payer: Self-pay

## 2022-04-06 NOTE — Telephone Encounter (Signed)
Spoke with patient's son Kasandra Knudsen and scheduled a in person Palliative Consult for 04/14/22 @ 2:30 PM.  Consent obtained; updated Netsmart, Team List and Epic.

## 2022-04-14 ENCOUNTER — Other Ambulatory Visit: Payer: Self-pay | Admitting: Primary Care

## 2022-04-14 DIAGNOSIS — M810 Age-related osteoporosis without current pathological fracture: Secondary | ICD-10-CM

## 2022-04-14 DIAGNOSIS — M5416 Radiculopathy, lumbar region: Secondary | ICD-10-CM

## 2022-04-14 DIAGNOSIS — Z515 Encounter for palliative care: Secondary | ICD-10-CM

## 2022-04-14 NOTE — Progress Notes (Signed)
[facility] Pall[facility] Consult Note Telephone: [phone]  Fax: [phone]   Date of encounter: 04/14/22 2:35 PM PATIENT NAME: Victoria Prince [address] [place] [id number]   [phone] (home)  DOB: Sep 20, 1928 MRN: [id number] PRIMARY CARE PROVIDER:    Sofie Hartigan, MD,  [facility] [place] [address] [phone]  REFERRING PROVIDER:   Sofie Hartigan, MD [address],  [place] [address] [phone]  RESPONSIBLE PARTY:    Contact Information     Name Relation Home Work Mobile   [place]   [phone]        I met face to face with patient and family in  home. Palliative Care was asked to follow this patient by consultation request of  Feldpausch, Chrissie Noa, MD to address advance care planning and complex medical decision making. This is the initial visit.                                      ASSESSMENT AND PLAN / RECOMMENDATIONS:   Advance Care Planning/Goals of Care: Goals include to maximize quality of life and symptom management. Patient/health care surrogate gave his/her permission to discuss.Our advance care planning conversation included a discussion about:    The value and importance of advance care planning  Experiences with loved ones who have been seriously ill or have died  Exploration of personal, cultural or spiritual beliefs that might influence medical decisions  Exploration of goals of care in the event of a sudden injury or illness  Identification of a healthcare agent-son Review of an  advance directive document. CODE STATUS: FULL  Symptom Management/Plan:  Pain: Has severe sciatica which is fairly well managed from bid and prn medications. Has been on Lyrica x 1 month. Recommend acetaminophen CR 650 mg but has obtained tylenol 500 mg. Has appt for pain management next month.Discussed pain management clinic expectation.  Mobility: Able to ambulate with walker and stand by.  Able to rise (I).   Sleep: Sleeping well with elavil, does not need any more medication for sleep.  Toileting: Able to get into bathroom with the wheel chair. continent  Nutrition: Endorses fair appetite. Eating 50% meals. Endorses stable weight. Education RE boost breeze due to lactose intolerance.   Follow up Palliative Care Visit: Informed of end of home visiting NP program. May call for RN, SW management.  I spent 60 minutes providing this consultation. More than 50% of the time in this consultation was spent in counseling and care coordination.  PPS: 40%  HOSPICE ELIGIBILITY/DIAGNOSIS: no  Chief Complaint: chronic pain  HISTORY OF PRESENT ILLNESS:  Victoria Prince is a 88 y.o. year old female  with Pain, debility . Patient seen today to review palliative care needs to include medical decision making and advance care planning as appropriate.   History obtained from review of EMR, discussion with primary team, and interview with family, facility staff/caregiver and/or Victoria Prince.  I reviewed available labs, medications, imaging, studies and related documents from the EMR.  Records reviewed and summarized above.   ROS  General: NAD ENMT: denies dysphagia Cardiovascular: denies chest pain, denies DOE Pulmonary: denies cough, denies increased SOB Abdomen: endorses fair  appetite, denies constipation, endorses continence of bowel GU: denies dysuria, endorses continence of urine MSK:  denies increased weakness, no falls reported Skin: denies rashes or wounds Neurological: endorses pain,  denies insomnia Psych: Endorses positive mood Heme/lymph/immuno: denies bruises, abnormal bleeding  Physical Exam: Current and past weights: stable Constitutional: NAD General: frail appearing EYES: anicteric sclera, lids intact, no discharge  ENMT: intact hearing, oral mucous membranes moist, dentition intact CV: no LE edema Pulmonary: no increased work of breathing, no cough, room  air Abdomen: intake 50%, soft and non tender, no ascites MSK: mod  sarcopenia, moves all extremities, ambulatory with walker  Skin: warm and dry, no rashes or wounds on visible skin Neuro: + generalized weakness,  + cognitive impairment Psych: non-anxious affect, A and O x 3 Hem/lymph/immuno: no widespread bruising  CURRENT PROBLEM LIST:  Patient Active Problem List   Diagnosis Date Noted   Spinal stenosis 03/10/2022   Neurogenic claudication due to lumbar spinal stenosis    Lumbar radiculopathy    Intractable back pain 03/09/2022   Impaired ambulation 03/09/2022   Candida infection 12/13/2019   Age-related osteoporosis without current pathological fracture 01/15/2019   Carcinoma of upper-outer quadrant of left breast in female, estrogen receptor positive ([place]) 01/20/2016   Cancer of breast ([place]) 01/12/2015   H/O total hip arthroplasty 11/11/2014   Neuritis or radiculitis due to rupture of lumbar intervertebral disc 01/10/2014   DDD (degenerative disc disease), lumbar 01/10/2014   Lumbar canal stenosis 01/10/2014   PAST MEDICAL HISTORY:  Active Ambulatory Problems    Diagnosis Date Noted   Neuritis or radiculitis due to rupture of lumbar intervertebral disc 01/10/2014   DDD (degenerative disc disease), lumbar 01/10/2014   Lumbar canal stenosis 01/10/2014   H/O total hip arthroplasty 11/11/2014   Cancer of breast ([place]) 01/12/2015   Carcinoma of upper-outer quadrant of left breast in female, estrogen receptor positive ([place]) 01/20/2016   Age-related osteoporosis without current pathological fracture 01/15/2019   Candida infection 12/13/2019   Intractable back pain 03/09/2022   Impaired ambulation 03/09/2022   Spinal stenosis 03/10/2022   Neurogenic claudication due to lumbar spinal stenosis    Lumbar radiculopathy    Resolved Ambulatory Problems    Diagnosis Date Noted   No Resolved Ambulatory Problems   Past Medical History:  Diagnosis Date   Breast cancer ([place]) 2011    Personal history of chemotherapy 2011   Personal history of radiation therapy 2011   Radiation 2011   S/P chemotherapy, time since greater than 12 weeks 2011   SOCIAL HX:  Social History   Tobacco Use   Smoking status: Never   Smokeless tobacco: Never  Substance Use Topics   Alcohol use: Never   FAMILY HX:  Family History  Problem Relation Age of Onset   Breast cancer Neg Hx       ALLERGIES:  Allergies  Allergen Reactions   Sulfa Antibiotics Rash     PERTINENT MEDICATIONS:  Outpatient Encounter Medications as of 04/14/2022  Medication Sig   amitriptyline (ELAVIL) 25 MG tablet Take 25 mg by mouth at bedtime.    amLODipine (NORVASC) 2.5 MG tablet Take 2.5 mg by mouth daily.    cetirizine (ZYRTEC) 10 MG tablet Take 1 tablet by mouth daily.   naproxen sodium (ALEVE) 220 MG tablet Take 220 mg by mouth daily as needed (pain).   nystatin powder Apply 1 Application topically 2 (two) times daily.   oxyCODONE (OXY IR/ROXICODONE) 5 MG immediate release tablet Take 1 tablet (5 mg total) by mouth every 6 (six) hours as needed for moderate pain.   pravastatin (PRAVACHOL) 40 MG tablet Take 40 mg by mouth daily.    pregabalin (LYRICA)  25 MG capsule Take 1 capsule (25 mg total) by mouth at bedtime.   senna-docusate (SENOKOT-S) 8.6-50 MG tablet Take 1 tablet by mouth at bedtime as needed for mild constipation.   triamcinolone cream (KENALOG) 0.5 % Apply topically as directed.   No facility-administered encounter medications on file as of 04/14/2022.   Thank you for the opportunity to participate in the care of Victoria Prince.  The palliative care team will continue to follow. Please call our office at [phone] if we can be of additional assistance.   Jason Coop, NP , DNP, AGPCNP-BC  COVID-19 PATIENT SCREENING TOOL Asked and negative response unless otherwise noted:  Have you had symptoms of covid, tested positive or been in contact with someone with symptoms/positive test  in the past 5-10 days?

## 2022-04-22 ENCOUNTER — Encounter: Payer: Self-pay | Admitting: Student in an Organized Health Care Education/Training Program

## 2022-04-22 ENCOUNTER — Ambulatory Visit
Payer: Medicare HMO | Attending: Student in an Organized Health Care Education/Training Program | Admitting: Student in an Organized Health Care Education/Training Program

## 2022-04-22 VITALS — BP 140/72 | HR 69 | Temp 97.2°F | Ht 62.0 in | Wt 143.0 lb

## 2022-04-22 DIAGNOSIS — G894 Chronic pain syndrome: Secondary | ICD-10-CM | POA: Insufficient documentation

## 2022-04-22 DIAGNOSIS — M48061 Spinal stenosis, lumbar region without neurogenic claudication: Secondary | ICD-10-CM | POA: Diagnosis not present

## 2022-04-22 DIAGNOSIS — M5416 Radiculopathy, lumbar region: Secondary | ICD-10-CM | POA: Diagnosis not present

## 2022-04-22 DIAGNOSIS — M48062 Spinal stenosis, lumbar region with neurogenic claudication: Secondary | ICD-10-CM | POA: Insufficient documentation

## 2022-04-22 NOTE — Progress Notes (Signed)
Safety precautions to be maintained throughout the outpatient stay will include: orient to surroundings, keep bed in low position, maintain call bell within reach at all times, provide assistance with transfer out of bed and ambulation.  

## 2022-04-22 NOTE — Progress Notes (Signed)
Patient: Victoria Prince  Service Category: E/M  Provider: Gillis Santa, MD  DOB: August 26, 1928  DOS: 04/22/2022  Referring Provider: Meade Maw, MD  MRN: [id number]  Setting: Ambulatory outpatient  PCP: Sofie Hartigan, MD  Type: New Patient  Specialty: Interventional Pain Management    Location: Office  Delivery: Face-to-face     Primary Reason(s) for Visit: Encounter for initial evaluation of one or more chronic problems (new to examiner) potentially causing chronic pain, and posing a threat to normal musculoskeletal function. (Level of risk: High) CC: Back Pain (lower)  HPI  Victoria Prince is a 88 y.o. year old, female patient, who comes for the first time to our practice referred by Meade Maw, MD for our initial evaluation of her chronic pain. She has Neuritis or radiculitis due to rupture of lumbar intervertebral disc; DDD (degenerative disc disease), lumbar; Lumbar canal stenosis; H/O total hip arthroplasty; Cancer of breast ([place]); Carcinoma of upper-outer quadrant of left breast in female, estrogen receptor positive ([place]); Age-related osteoporosis without current pathological fracture; Candida infection; Intractable back pain; Impaired ambulation; Spinal stenosis; Neurogenic claudication due to lumbar spinal stenosis; Lumbar radiculopathy; and Chronic pain syndrome on their problem list. Today she comes in for evaluation of her Back Pain (lower)  Pain Assessment: Location: Right Back Radiating: pain radiaties down her right leg to her foot Onset: More than a month ago Duration: Chronic pain Quality: Constant, Aching, Discomfort Severity: 7 /10 (subjective, self-reported pain score)  Effect on ADL: limits my daily activities Timing: Constant Modifying factors: meds BP: (!) 140/72  HR: 69  Onset and Duration: Date of injury: 03-15-2022 and Present less than 3 months Cause of pain:  pinched nerve Severity: Getting better and NAS-11 at its worse: 10/10 Timing: Not  influenced by the time of the day, During activity or exercise, and After activity or exercise Aggravating Factors: Motion and Walking Alleviating Factors: Medications Associated Problems: Constipation, Depression, Dizziness, Fatigue, Sadness, Tingling, Vomiting , and Weakness Quality of Pain: Aching, Agonizing, and Tingling Previous Examinations or Tests: CT scan, MRI scan, X-rays, and Neurological evaluation Previous Treatments: Epidural steroid injections and Narcotic medications  Victoria Prince is a pleasant 88 year old female who presents with a chief complaint of low back pain with radiation into her right leg that started in September 2023 without any inciting or traumatic event.  She has difficulty walking secondary to the pain.  It has worsened over the last couple of months.  She has trouble walking more than a couple of steps without weakness in her legs.  Her lumbar MRI shows right L5 and/or S1 radiculitis.  She is being referred here from Dr. Cari Caraway for a right L4-L5 versus L5-S1 ESI.  She is continuing physical therapy. Outpatient medications include acetaminophen 650 mg every 6 hours, amitriptyline 25 mg nightly and oxycodone that she takes for severe pain.  She is also on Lyrica 25 mg nightly.    Meds   Current Outpatient Medications:    acetaminophen (TYLENOL) 325 MG tablet, Take 650 mg by mouth every 6 (six) hours as needed., Disp: , Rfl:    amitriptyline (ELAVIL) 25 MG tablet, Take 25 mg by mouth at bedtime. , Disp: , Rfl:    amLODipine (NORVASC) 2.5 MG tablet, Take 2.5 mg by mouth daily. , Disp: , Rfl:    cetirizine (ZYRTEC) 10 MG tablet, Take 1 tablet by mouth daily., Disp: , Rfl:    nystatin powder, Apply 1 Application topically 2 (two) times daily., Disp: , Rfl:  oxyCODONE (OXY IR/ROXICODONE) 5 MG immediate release tablet, Take 1 tablet (5 mg total) by mouth every 6 (six) hours as needed for moderate pain., Disp: 10 tablet, Rfl: 0   pravastatin (PRAVACHOL) 40 MG tablet,  Take 40 mg by mouth daily. , Disp: , Rfl:    pregabalin (LYRICA) 25 MG capsule, Take 1 capsule (25 mg total) by mouth at bedtime., Disp: 30 capsule, Rfl: 1   senna-docusate (SENOKOT-S) 8.6-50 MG tablet, Take 1 tablet by mouth at bedtime as needed for mild constipation., Disp: 30 tablet, Rfl: 0   naproxen sodium (ALEVE) 220 MG tablet, Take 220 mg by mouth daily as needed (pain). (Patient not taking: Reported on 04/22/2022), Disp: , Rfl:    triamcinolone cream (KENALOG) 0.5 %, Apply topically as directed. (Patient not taking: Reported on 04/22/2022), Disp: , Rfl:   Imaging Review  MR LUMBAR SPINE WO CONTRAST  Narrative CLINICAL DATA:  88 year old female with back pain radiating to the right hip and leg. No known injury.  EXAM: MRI LUMBAR SPINE WITHOUT CONTRAST  TECHNIQUE: Multiplanar, multisequence MR imaging of the lumbar spine was performed. No intravenous contrast was administered.  COMPARISON:  Lumbar MRI 11/28/2013.  FINDINGS: Segmentation: Lumbar segmentation appears to be normal and will be designated as such for this report. This is the same numbering system used on the 2015 MRI.  Alignment: Chronically preserved lumbar lordosis. Grade 1 anterolisthesis of L4 on L5 has increased since 2015, 4-5 mm now. See additional details of that level below. Mild dextroconvex lumbar scoliosis is chronic. Normal alignment otherwise.  Vertebrae: Visualized bone marrow signal is within normal limits. No marrow edema or evidence of acute osseous abnormality. Intact visible sacrum.  Conus medullaris and cauda equina: Conus extends to the T12-L1 level. No lower spinal cord or conus signal abnormality. Capacious spinal canal above L4-L5. Generally normal cauda equina nerve roots.  Paraspinal and other soft tissues: Benign right renal cyst has regressed (no follow-up imaging recommended). Diverticulosis of the visible large bowel. Small round simple 1.9 cm right ovarian cyst is visible at  the pelvic inlet series 8, image 33.  1.9 cm right ovarian simple-appearing cyst. No follow-up imaging is recommended.  Reference: JACR 2020 Feb;17(2):248-254  However, the uterine endometrial stripe is also visible on series 5, image 8 and measures 8-9 mm (up to 8 mm is within normal limits but 3 mm is average).  Disc levels:  Normal for age T11-T12 through L3-L4; mild to moderate facet and ligament flavum hypertrophy at those levels has increased since 2015.  L4-L5: Grade 1 anterolisthesis with mild circumferential disc/pseudo disc. Severe facet and ligament flavum hypertrophy with progression since 2015. Degenerative facet joint fluid. Progressed and now severe spinal stenosis, maximal just below the disc space level (series 5, image 8 and series 8 image 30). Right greater than left lateral recess involvement (L5 nerve levels). Only borderline to mild L4 foraminal stenosis.  L5-S1: Bulky chronic left far lateral disc osteophyte complex. Moderate to severe facet and ligament flavum hypertrophy with chronic facet joint fluid. No spinal stenosis. Mild to moderate left and moderate to severe right lateral recess stenosis is greater on the right (right S1 nerve level) and mildly progressed since 2015 (series 5, image 6). No foraminal stenosis.  IMPRESSION: 1. Increased chronic grade 1 anterolisthesis at L4-L5 along with severe facet arthropathy since 2015. Increased chronic spinal and lateral recess stenosis there, now severe. And severe posterior element degeneration also at L5-S1, with increased moderate to severe right lateral recess stenosis  at that level. Query Right L5 and/or S1 radiculitis.  2. No acute osseous abnormality. And other lumbar levels are normal for age.  3. Borderline abnormal endometrial thickening for an asymptomatic postmenopausal patient. Endometrial sampling should be considered to exclude carcinoma.   Electronically Signed By: Genevie Ann  M.D. On: 03/09/2022 04:55  CT Hip Right Wo Contrast  Narrative CLINICAL DATA:  Hip pain, stress fracture suspected.  EXAM: CT OF THE RIGHT HIP WITHOUT CONTRAST  TECHNIQUE: Multidetector CT imaging of the right hip was performed according to the standard protocol. Multiplanar CT image reconstructions were also generated.  RADIATION DOSE REDUCTION: This exam was performed according to the departmental dose-optimization program which includes automated exposure control, adjustment of the mA and/or kV according to patient size and/or use of iterative reconstruction technique.  COMPARISON:  Right hip x-ray 03/08/2022.  FINDINGS: Bones/Joint/Cartilage  No evidence for fracture. No focal osseous lesion. No significant joint effusion.  Ligaments  Suboptimally assessed by CT.  Muscles and Tendons  Within normal limits.  Soft tissues  There is subcutaneous stranding and small amount of hyperdensity in the inferior right gluteal region which may represent a small amount of hematoma. No fluid collection.  IMPRESSION: 1. No acute fracture or dislocation of the right hip. 2. Small amount of inferior right gluteal subcutaneous hyperdensity may represent hematoma. Correlate clinically.   Electronically Signed By: Ronney Asters M.D. On: 03/09/2022 02:09  Narrative CLINICAL DATA:  Pain.  EXAM: DG HIP (WITH OR WITHOUT PELVIS) 2-3V RIGHT  COMPARISON:  None Available.  FINDINGS: There is no evidence of hip fracture or dislocation. Left hip arthroplasty appears uncomplicated. There is no evidence of arthropathy or other focal bone abnormality.  IMPRESSION: Negative.   Electronically Signed By: Ronney Asters M.D. On: 03/08/2022 20:44  Complexity Note: Imaging results reviewed.                         ROS  Cardiovascular: HTN Pulmonary or Respiratory: No reported pulmonary signs or symptoms such as wheezing and difficulty taking a deep full breath (Asthma),  difficulty blowing air out (Emphysema), coughing up mucus (Bronchitis), persistent dry cough, or temporary stoppage of breathing during sleep Neurological: No reported neurological signs or symptoms such as seizures, abnormal skin sensations, urinary and/or fecal incontinence, being born with an abnormal open spine and/or a tethered spinal cord Psychological-Psychiatric: Anxiousness and Difficulty sleeping and or falling asleep Gastrointestinal: Alternating episodes iof diarrhea and constipation (IBS-Irritable bowe syndrome) and Irregular, infrequent bowel movements (Constipation) Genitourinary: No reported renal or genitourinary signs or symptoms such as difficulty voiding or producing urine, peeing blood, non-functioning kidney, kidney stones, difficulty emptying the bladder, difficulty controlling the flow of urine, or chronic kidney disease Hematological: Brusing easily Endocrine: No reported endocrine signs or symptoms such as high or low blood sugar, rapid heart rate due to high thyroid levels, obesity or weight gain due to slow thyroid or thyroid disease Rheumatologic: No reported rheumatological signs and symptoms such as fatigue, joint pain, tenderness, swelling, redness, heat, stiffness, decreased range of motion, with or without associated rash Musculoskeletal: Negative for myasthenia gravis, muscular dystrophy, multiple sclerosis or malignant hyperthermia Work History: Retired  Allergies  Victoria Prince is allergic to sulfa antibiotics.  Laboratory Chemistry Profile   Renal Lab Results  Component Value Date   BUN 29 (H) 03/11/2022   CREATININE 1.06 (H) 03/11/2022   GFRAA 41 (L) 12/13/2019   GFRNONAA 50 (L) 03/11/2022   PROTEINUR Negative 04/18/2014  Electrolytes Lab Results  Component Value Date   NA 139 03/11/2022   K 4.7 03/11/2022   CL 107 03/11/2022   CALCIUM 9.4 03/11/2022   MG 2.5 (H) 03/11/2022     Hepatic Lab Results  Component Value Date   AST 30 12/30/2021    ALT 24 12/30/2021   ALBUMIN 4.0 12/30/2021   ALKPHOS 53 12/30/2021     ID Lab Results  Component Value Date   SARSCOV2NAA NEGATIVE 02/02/2022     Bone No results found for: "VD25OH", "VD125OH2TOT", "SV7793JQ3", "ES9233AQ7", "25OHVITD1", "25OHVITD2", "62UQJFHL4", "TESTOFREE", "TESTOSTERONE"   Endocrine Lab Results  Component Value Date   GLUCOSE 124 (H) 03/11/2022   GLUCOSEU Negative 04/18/2014     Neuropathy No results found for: "VITAMINB12", "FOLATE", "HGBA1C", "HIV"   CNS No results found for: "COLORCSF", "APPEARCSF", "RBCCOUNTCSF", "WBCCSF", "POLYSCSF", "LYMPHSCSF", "EOSCSF", "PROTEINCSF", "GLUCCSF", "JCVIRUS", "CSFOLI", "IGGCSF", "LABACHR", "ACETBL"   Inflammation (CRP: Acute  ESR: Chronic) Lab Results  Component Value Date   ESRSEDRATE 12 04/18/2014     Rheumatology No results found for: "RF", "ANA", "LABURIC", "URICUR", "LYMEIGGIGMAB", "LYMEABIGMQN", "HLAB27"   Coagulation Lab Results  Component Value Date   INR 1.0 04/18/2014   LABPROT 12.8 04/18/2014   APTT 24.7 04/18/2014   PLT 212 03/11/2022     Cardiovascular Lab Results  Component Value Date   HGB 12.6 03/11/2022   HCT 39.4 03/11/2022     Screening Lab Results  Component Value Date   SARSCOV2NAA NEGATIVE 02/02/2022     Cancer Lab Results  Component Value Date   LABCA2 20.2 06/27/2013     Allergens No results found for: "ALMOND", "APPLE", "ASPARAGUS", "AVOCADO", "BANANA", "BARLEY", "BASIL", "BAYLEAF", "GREENBEAN", "LIMABEAN", "WHITEBEAN", "BEEFIGE", "REDBEET", "BLUEBERRY", "BROCCOLI", "CABBAGE", "MELON", "CARROT", "CASEIN", "CASHEWNUT", "CAULIFLOWER", "CELERY"     Note: Lab results reviewed.  [place]  Drug: Victoria Prince  reports no history of drug use. Alcohol:  reports no history of alcohol use. Tobacco:  reports that she has never smoked. She has never used smokeless tobacco. Medical:  has a past medical history of Breast cancer ([place]) (2011), Personal history of chemotherapy (2011),  Personal history of radiation therapy (2011), Radiation (2011), and S/P chemotherapy, time since greater than 12 weeks (2011). Family: family history is not on file.  Past Surgical History:  Procedure Laterality Date   BREAST BIOPSY Left 2012   negative   BREAST BIOPSY Left 2011   2 areas removed. Retroaerola area benign.  UOQ was DCIS with positive margin per Dr. Rogue Bussing notes.    BREAST BIOPSY Right 12/26/2019   [place] bx, Q marker, path pending   SKIN LESION EXCISION  06/2021   Active Ambulatory Problems    Diagnosis Date Noted   Neuritis or radiculitis due to rupture of lumbar intervertebral disc 01/10/2014   DDD (degenerative disc disease), lumbar 01/10/2014   Lumbar canal stenosis 01/10/2014   H/O total hip arthroplasty 11/11/2014   Cancer of breast ([place]) 01/12/2015   Carcinoma of upper-outer quadrant of left breast in female, estrogen receptor positive ([place]) 01/20/2016   Age-related osteoporosis without current pathological fracture 01/15/2019   Candida infection 12/13/2019   Intractable back pain 03/09/2022   Impaired ambulation 03/09/2022   Spinal stenosis 03/10/2022   Neurogenic claudication due to lumbar spinal stenosis    Lumbar radiculopathy    Chronic pain syndrome 04/22/2022   Resolved Ambulatory Problems    Diagnosis Date Noted   No Resolved Ambulatory Problems   Past Medical History:  Diagnosis Date   Breast cancer ([place])  2011   Personal history of chemotherapy 2011   Personal history of radiation therapy 2011   Radiation 2011   S/P chemotherapy, time since greater than 12 weeks 2011   Constitutional Exam  General appearance: Well nourished, well developed, and well hydrated. In no apparent acute distress Vitals:   04/22/22 1043  BP: (!) 140/72  Pulse: 69  Temp: (!) 97.2 F (36.2 C)  SpO2: 99%  Weight: 143 lb (64.9 kg)  Height: _0  (1.575 m)   BMI Assessment: Estimated body mass index is 26.16 kg/m as calculated from the following:   Height as  of this encounter: _1  (1.575 m).   Weight as of this encounter: 143 lb (64.9 kg).  BMI interpretation table: BMI level Category Range association with higher incidence of chronic pain  <18 kg/m2 Underweight   18.5-24.9 kg/m2 Ideal body weight   25-29.9 kg/m2 Overweight Increased incidence by 20%  30-34.9 kg/m2 Obese (Class I) Increased incidence by 68%  35-39.9 kg/m2 Severe obesity (Class II) Increased incidence by 136%  >40 kg/m2 Extreme obesity (Class III) Increased incidence by 254%   Patient's current BMI Ideal Body weight  Body mass index is 26.16 kg/m. Ideal body weight: 50.1 kg (110 lb 7.2 oz) Adjusted ideal body weight: 56 kg (123 lb 7.5 oz)   BMI Readings from Last 4 Encounters:  04/22/22 26.16 kg/m  03/19/22 25.70 kg/m  03/09/22 25.83 kg/m  02/02/22 26.70 kg/m   Wt Readings from Last 4 Encounters:  04/22/22 143 lb (64.9 kg)  03/19/22 136 lb (61.7 kg)  03/09/22 136 lb 11 oz (62 kg)  02/02/22 146 lb (66.2 kg)    Psych/Mental status: Alert, oriented x 3 (person, place, & time)       Eyes: PERLA Respiratory: No evidence of acute respiratory distress  Lumbar Spine Area Exam  Skin & Axial Inspection: Thoraco-lumbar Scoliosis Alignment: Symmetrical Functional ROM: Pain restricted ROM affecting primarily the right Stability: No instability detected Muscle Tone/Strength: Functionally intact. No obvious neuro-muscular anomalies detected. Sensory (Neurological): Dermatomal pain pattern right L4/5 Provocative Tests:  Lumbar quadrant test (Kemp's test): (+) on the right for foraminal stenosis Lateral bending test: (+) ipsilateral radicular pain, on the right. Positive for right-sided foraminal stenosis.  Gait & Posture Assessment  Ambulation: Patient came in today in a wheel chair Gait: Significantly limited. Dependent on assistive device to ambulate Posture: Difficulty standing up straight, due to pain  Lower Extremity Exam    Side: Right lower extremity  Side:  Left lower extremity  Stability: No instability observed          Stability: No instability observed          Skin & Extremity Inspection: Skin color, temperature, and hair growth are WNL. No peripheral edema or cyanosis. No masses, redness, swelling, asymmetry, or associated skin lesions. No contractures.  Skin & Extremity Inspection: Skin color, temperature, and hair growth are WNL. No peripheral edema or cyanosis. No masses, redness, swelling, asymmetry, or associated skin lesions. No contractures.  Functional ROM: Pain restricted ROM for all joints of the lower extremity + SLR          Functional ROM: Unrestricted ROM                  Muscle Tone/Strength: Functionally intact. No obvious neuro-muscular anomalies detected.  Muscle Tone/Strength: Functionally intact. No obvious neuro-muscular anomalies detected.  Sensory (Neurological): Dermatomal pain pattern        Sensory (Neurological): Unimpaired  DTR: Patellar: deferred today Achilles: deferred today Plantar: deferred today  DTR: Patellar: deferred today Achilles: deferred today Plantar: deferred today  Palpation: No palpable anomalies  Palpation: No palpable anomalies    Assessment  Primary Diagnosis & Pertinent Problem List: The primary encounter diagnosis was Neurogenic claudication due to lumbar spinal stenosis. Diagnoses of Spinal stenosis of lumbar region without neurogenic claudication, Lumbar radiculopathy (right L4/5), and Chronic pain syndrome were also pertinent to this visit.  Visit Diagnosis (New problems to examiner): 1. 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 Spinal stenosis of lumbar region without neurogenic claudication   3. Lumbar radiculopathy (right L4/5)   4. Chronic pain syndrome    Plan of Care (Initial workup plan)   Continue with physical therapy and multimodal analgesics via PCP.  Discussed right L4-L5 lumbar epidural steroid injection for right lumbar radiculopathy.  Risks and  benefits reviewed and patient would like to proceed.   Procedure Orders         Lumbar Epidural Injection      Orders Placed This Encounter  Procedures   Lumbar Epidural Injection    Standing Status:   Future    Standing Expiration Date:   07/23/2022    Scheduling Instructions:     Procedure: Interlaminar Lumbar Epidural Steroid injection (LESI)            Laterality: Right L4-5     Sedation: without     Timeframe: ASAA    Order Specific Question:   Where will this procedure be performed?    Answer:   [facility]      Provider-requested follow-up: Return in about 1 week (around 04/29/2022) for Right L4/5 or caudal , in clinic NS. I spent a total of 60 minutes reviewing chart data, face-to-face evaluation with the patient, counseling and coordination of care as detailed above.   Future Appointments  Date Time Provider [place]  05/07/2022  1:45 PM Meade Maw, MD [facility] None  12/29/2022  9:45 AM [facility] None  12/29/2022 10:00 AM Cammie Sickle, MD [facility] None    Note by: Gillis Santa, MD Date: 04/22/2022; Time: 2:22 PM

## 2022-04-29 ENCOUNTER — Encounter: Payer: Self-pay | Admitting: Student in an Organized Health Care Education/Training Program

## 2022-04-29 ENCOUNTER — Ambulatory Visit
Payer: Medicare HMO | Attending: Student in an Organized Health Care Education/Training Program | Admitting: Student in an Organized Health Care Education/Training Program

## 2022-04-29 ENCOUNTER — Ambulatory Visit
Admission: RE | Admit: 2022-04-29 | Discharge: 2022-04-29 | Disposition: A | Discharge status: Home / Self Care | Payer: Medicare HMO | Source: Ambulatory Visit | Attending: Student in an Organized Health Care Education/Training Program | Admitting: Student in an Organized Health Care Education/Training Program

## 2022-04-29 DIAGNOSIS — M48062 Spinal stenosis, lumbar region with neurogenic claudication: Secondary | ICD-10-CM | POA: Insufficient documentation

## 2022-04-29 DIAGNOSIS — M48061 Spinal stenosis, lumbar region without neurogenic claudication: Secondary | ICD-10-CM | POA: Diagnosis present

## 2022-04-29 DIAGNOSIS — G894 Chronic pain syndrome: Secondary | ICD-10-CM | POA: Insufficient documentation

## 2022-04-29 DIAGNOSIS — M5416 Radiculopathy, lumbar region: Secondary | ICD-10-CM | POA: Insufficient documentation

## 2022-04-29 MED ORDER — SODIUM CHLORIDE 0.9% FLUSH
2.0000 mL | Freq: Once | INTRAVENOUS | Status: AC
  Administered 2022-04-29: 2 mL

## 2022-04-29 MED ORDER — DEXAMETHASONE SODIUM PHOSPHATE 10 MG/ML IJ SOLN
10.0000 mg | Freq: Once | INTRAMUSCULAR | Status: AC
  Administered 2022-04-29: 10 mg

## 2022-04-29 MED ORDER — DEXAMETHASONE SODIUM PHOSPHATE 10 MG/ML IJ SOLN
INTRAMUSCULAR | Status: AC
  Filled 2022-04-29: qty 1

## 2022-04-29 MED ORDER — IOHEXOL 180 MG/ML  SOLN
10.0000 mL | Freq: Once | INTRAMUSCULAR | Status: AC
  Administered 2022-04-29: 10 mL via EPIDURAL

## 2022-04-29 MED ORDER — ROPIVACAINE HCL 2 MG/ML IJ SOLN
2.0000 mL | Freq: Once | INTRAMUSCULAR | Status: AC
  Administered 2022-04-29: 2 mL via EPIDURAL

## 2022-04-29 MED ORDER — LIDOCAINE HCL 2 % IJ SOLN
20.0000 mL | Freq: Once | INTRAMUSCULAR | Status: AC
  Administered 2022-04-29: 100 mg

## 2022-04-29 MED ORDER — LIDOCAINE HCL (PF) 2 % IJ SOLN
INTRAMUSCULAR | Status: AC
  Filled 2022-04-29: qty 10

## 2022-04-29 MED ORDER — SODIUM CHLORIDE (PF) 0.9 % IJ SOLN
INTRAMUSCULAR | Status: AC
  Filled 2022-04-29: qty 10

## 2022-04-29 MED ORDER — IOHEXOL 180 MG/ML  SOLN
INTRAMUSCULAR | Status: AC
  Filled 2022-04-29: qty 20

## 2022-04-29 MED ORDER — ROPIVACAINE HCL 2 MG/ML IJ SOLN
INTRAMUSCULAR | Status: AC
  Filled 2022-04-29: qty 20

## 2022-04-29 NOTE — Progress Notes (Signed)
Safety precautions to be maintained throughout the outpatient stay will include: orient to surroundings, keep bed in low position, maintain call bell within reach at all times, provide assistance with transfer out of bed and ambulation.  

## 2022-04-29 NOTE — Patient Instructions (Signed)
____________________________________________________________________________________________  Post-Procedure Discharge Instructions  Instructions: Apply ice:  Purpose: This will minimize any swelling and discomfort after procedure.  When: Day of procedure, as soon as you get home. How: Fill a plastic sandwich bag with crushed ice. Cover it with a small towel and apply to injection site. How long: (15 min on, 15 min off) Apply for 15 minutes then remove x 15 minutes.  Repeat sequence on day of procedure, until you go to bed. Apply heat:  Purpose: To treat any soreness and discomfort from the procedure. When: Starting the next day after the procedure. How: Apply heat to procedure site starting the day following the procedure. How long: May continue to repeat daily, until discomfort goes away. Food intake: Start with clear liquids (like water) and advance to regular food, as tolerated.  Physical activities: Keep activities to a minimum for the first 8 hours after the procedure. After that, then as tolerated. Driving: If you have received any sedation, be responsible and do not drive. You are not allowed to drive for 24 hours after having sedation. Blood thinner: (Applies only to those taking blood thinners) You may restart your blood thinner 6 hours after your procedure. Insulin: (Applies only to Diabetic patients taking insulin) As soon as you can eat, you may resume your normal dosing schedule. Infection prevention: Keep procedure site clean and dry. Shower daily and clean area with soap and water. Post-procedure Pain Diary: Extremely important that this be done correctly and accurately. Recorded information will be used to determine the next step in treatment. For the purpose of accuracy, follow these rules: Evaluate only the area treated. Do not report or include pain from an untreated area. For the purpose of this evaluation, ignore all other areas of pain, except for the treated area. After  your procedure, avoid taking a long nap and attempting to complete the pain diary after you wake up. Instead, set your alarm clock to go off every hour, on the hour, for the initial 8 hours after the procedure. Document the duration of the numbing medicine, and the relief you are getting from it. Do not go to sleep and attempt to complete it later. It will not be accurate. If you received sedation, it is likely that you were given a medication that may cause amnesia. Because of this, completing the diary at a later time may cause the information to be inaccurate. This information is needed to plan your care. Follow-up appointment: Keep your post-procedure follow-up evaluation appointment after the procedure (usually 2 weeks for most procedures, 6 weeks for radiofrequencies). DO NOT FORGET to bring you pain diary with you.   Expect: (What should I expect to see with my procedure?) From numbing medicine (AKA: Local Anesthetics): Numbness or decrease in pain. You may also experience some weakness, which if present, could last for the duration of the local anesthetic. Onset: Full effect within 15 minutes of injected. Duration: It will depend on the type of local anesthetic used. On the average, 1 to 8 hours.  From steroids (Applies only if steroids were used): Decrease in swelling or inflammation. Once inflammation is improved, relief of the pain will follow. Onset of benefits: Depends on the amount of swelling present. The more swelling, the longer it will take for the benefits to be seen. In some cases, up to 10 days. Duration: Steroids will stay in the system x 2 weeks. Duration of benefits will depend on multiple posibilities including persistent irritating factors. Side-effects: If present, they  may typically last 2 weeks (the duration of the steroids). Frequent: Cramps (if they occur, drink Gatorade and take over-the-counter Magnesium 450-500 mg once to twice a day); water retention with temporary  weight gain; increases in blood sugar; decreased immune system response; increased appetite. Occasional: Facial flushing (red, warm cheeks); mood swings; menstrual changes. Uncommon: Long-term decrease or suppression of natural hormones; bone thinning. (These are more common with higher doses or more frequent use. This is why we prefer that our patients avoid having any injection therapies in other practices.)  Very Rare: Severe mood changes; psychosis; aseptic necrosis. From procedure: Some discomfort is to be expected once the numbing medicine wears off. This should be minimal if ice and heat are applied as instructed.  Call if: (When should I call?) You experience numbness and weakness that gets worse with time, as opposed to wearing off. New onset bowel or bladder incontinence. (Applies only to procedures done in the spine)  Emergency Numbers: Durning business hours (Monday - Thursday, 8:00 AM - 4:00 PM) (Friday, 9:00 AM - 12:00 Noon): [phone] After hours: [phone] NOTE: If you are having a problem and are unable connect with, or to talk to a provider, then go to your nearest urgent care or emergency department. If the problem is serious and urgent, please call 911. ____________________________________________________________________________________________  Epidural Steroid Injection Patient Information  Description: The epidural space surrounds the nerves as they exit the spinal cord.  In some patients, the nerves can be compressed and inflamed by a bulging disc or a tight spinal canal (spinal stenosis).  By injecting steroids into the epidural space, we can bring irritated nerves into direct contact with a potentially helpful medication.  These steroids act directly on the irritated nerves and can reduce swelling and inflammation which often leads to decreased pain.  Epidural steroids may be injected anywhere along the spine and from the neck to the low back depending upon the  location of your pain.   After numbing the skin with local anesthetic (like Novocaine), a small needle is passed into the epidural space slowly.  You may experience a sensation of pressure while this is being done.  The entire block usually last less than 10 minutes.  Conditions which may be treated by epidural steroids:  Low back and leg pain Neck and arm pain Spinal stenosis Post-laminectomy syndrome Herpes zoster (shingles) pain Pain from compression fractures  Preparation for the injection:  Do not eat any solid food or dairy products within 8 hours of your appointment.  You may drink clear liquids up to 3 hours before appointment.  Clear liquids include water, black coffee, juice or soda.  No milk or cream please. You may take your regular medication, including pain medications, with a sip of water before your appointment  Diabetics should hold regular insulin (if taken separately) and take 1/2 normal NPH dos the morning of the procedure.  Carry some sugar containing items with you to your appointment. A driver must accompany you and be prepared to drive you home after your procedure.  Bring all your current medications with your. An IV may be inserted and sedation may be given at the discretion of the physician.   A blood pressure cuff, EKG and other monitors will often be applied during the procedure.  Some patients may need to have extra oxygen administered for a short period. You will be asked to provide medical information, including your allergies, prior to the procedure.  We must know immediately  if you are taking blood thinners (like Coumadin/Warfarin)  Or if you are allergic to IV iodine contrast (dye). We must know if you could possible be pregnant.  Possible side-effects: Bleeding from needle site Infection (rare, may require surgery) Nerve injury (rare) Numbness & tingling (temporary) Difficulty urinating (rare, temporary) Spinal headache ( a headache worse with upright  posture) Light -headedness (temporary) Pain at injection site (several days) Decreased blood pressure (temporary) Weakness in arm/leg (temporary) Pressure sensation in back/neck (temporary)  Call if you experience: Fever/chills associated with headache or increased back/neck pain. Headache worsened by an upright position. New onset weakness or numbness of an extremity below the injection site Hives or difficulty breathing (go to the emergency room) Inflammation or drainage at the infection site Severe back/neck pain Any new symptoms which are concerning to you  Please note:  Although the local anesthetic injected can often make your back or neck feel good for several hours after the injection, the pain will likely return.  It takes 3-7 days for steroids to work in the epidural space.  You may not notice any pain relief for at least that one week.  If effective, we will often do a series of three injections spaced 3-6 weeks apart to maximally decrease your pain.  After the initial series, we generally will wait several months before considering a repeat injection of the same type.  If you have any questions, please call [phone] [facility]

## 2022-04-29 NOTE — Progress Notes (Signed)
PROVIDER NOTE: Interpretation of information contained herein should be left to medically-trained personnel. Specific patient instructions are provided elsewhere under "Patient Instructions" section of medical record. This document was created in part using STT-dictation technology, any transcriptional errors that may result from this process are unintentional.  Patient: Victoria Prince Type: Established DOB: December 17, 1928 MRN: [id number] PCP: Sofie Hartigan, MD  Service: Procedure DOS: 04/29/2022 Setting: Ambulatory Location: Ambulatory outpatient facility Delivery: Face-to-face Provider: Gillis Santa, MD Specialty: Interventional Pain Management Specialty designation: 09 Location: Outpatient facility Ref. Prov.: Gillis Santa, MD    Primary Reason for Visit: Interventional Pain Management Treatment. CC: Back Pain (Right lower)   Procedure:           Type: Lumbar epidural steroid injection (LESI) (interlaminar) #1    Laterality: Right   Level:  L4-5 Level.  Imaging: Fluoroscopic guidance Spinal ([id number]) Anesthesia: Local anesthesia (1-2% Lidocaine) DOS: 04/29/2022  Performed by: Gillis Santa, MD  Purpose: Diagnostic/Therapeutic Indications: Lumbar radicular pain of intraspinal etiology of more than 4 weeks that has failed to respond to conservative therapy and is severe enough to impact quality of life or function. 1. 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 Spinal stenosis of lumbar region without neurogenic claudication   3. Lumbar radiculopathy (right L4/5)   4. Chronic pain syndrome    NAS-11 Pain score:   Pre-procedure: 4 /10   Post-procedure: 6 /10      Position / Prep / Materials:  Position: Prone w/ head of the table raised (slight reverse trendelenburg) to facilitate breathing.  Prep solution: DuraPrep (Iodine Povacrylex [0.7% available iodine] and Isopropyl Alcohol, 74% w/w) Prep Area: Entire Posterior Lumbar Region from lower scapular tip  down to mid buttocks area and from flank to flank. Materials:  Tray: Epidural tray Needle(s):  Type: Epidural needle (Tuohy) Gauge (G):  22 Length: Regular (3.5-in) Qty: 1  Pre-op H&P Assessment:  Victoria Prince is a 88 y.o. (year old), female patient, seen today for interventional treatment. She  has a past surgical history that includes Breast biopsy (Left, 2012); Breast biopsy (Left, 2011); Breast biopsy (Right, 12/26/2019); and Skin lesion excision (06/2021). Victoria Prince has a current medication list which includes the following prescription(s): acetaminophen, amitriptyline, amlodipine, cetirizine, nystatin, oxycodone, pravastatin, pregabalin, senna-docusate, naproxen sodium, and triamcinolone cream. Her primarily concern today is the Back Pain (Right lower)  Initial Vital Signs:  Pulse/HCG Rate: 72ECG Heart Rate: 100 Temp: 97.7 F (36.5 C) Resp: 17 BP: (!) 135/123 SpO2: 97 %  BMI: Estimated body mass index is 25.97 kg/m as calculated from the following:   Height as of this encounter: '5\' 2"'$  (1.575 m).   Weight as of this encounter: 142 lb (64.4 kg).  Risk Assessment: Allergies: Reviewed. She is allergic to sulfa antibiotics.  Allergy Precautions: None required Coagulopathies: Reviewed. None identified.  Blood-thinner therapy: None at this time Active Infection(s): Reviewed. None identified. Victoria Prince is afebrile  Site Confirmation: Victoria Prince was asked to confirm the procedure and laterality before marking the site Procedure checklist: Completed Consent: Before the procedure and under the influence of no sedative(s), amnesic(s), or anxiolytics, the patient was informed of the treatment options, risks and possible complications. To fulfill our ethical and legal obligations, as recommended by the American Medical Association's Code of Ethics, I have informed the patient of my clinical impression; the nature and purpose of the treatment or procedure; the risks, benefits, and  possible complications of the intervention; the alternatives, including doing nothing; the risk(s) and  benefit(s) of the alternative treatment(s) or procedure(s); and the risk(s) and benefit(s) of doing nothing. The patient was provided information about the general risks and possible complications associated with the procedure. These may include, but are not limited to: failure to achieve desired goals, infection, bleeding, organ or nerve damage, allergic reactions, paralysis, and death. In addition, the patient was informed of those risks and complications associated to Spine-related procedures, such as failure to decrease pain; infection (i.e.: Meningitis, epidural or intraspinal abscess); bleeding (i.e.: epidural hematoma, subarachnoid hemorrhage, or any other type of intraspinal or peri-dural bleeding); organ or nerve damage (i.e.: Any type of peripheral nerve, nerve root, or spinal cord injury) with subsequent damage to sensory, motor, and/or autonomic systems, resulting in permanent pain, numbness, and/or weakness of one or several areas of the body; allergic reactions; (i.e.: anaphylactic reaction); and/or death. Furthermore, the patient was informed of those risks and complications associated with the medications. These include, but are not limited to: allergic reactions (i.e.: anaphylactic or anaphylactoid reaction(s)); adrenal axis suppression; blood sugar elevation that in diabetics may result in ketoacidosis or comma; water retention that in patients with history of congestive heart failure may result in shortness of breath, pulmonary edema, and decompensation with resultant heart failure; weight gain; swelling or edema; medication-induced neural toxicity; particulate matter embolism and blood vessel occlusion with resultant organ, and/or nervous system infarction; and/or aseptic necrosis of one or more joints. Finally, the patient was informed that Medicine is not an exact science; therefore, there  is also the possibility of unforeseen or unpredictable risks and/or possible complications that may result in a catastrophic outcome. The patient indicated having understood very clearly. We have given the patient no guarantees and we have made no promises. Enough time was given to the patient to ask questions, all of which were answered to the patient's satisfaction. Ms. Vlcek has indicated that she wanted to continue with the procedure. Attestation: I, the ordering provider, attest that I have discussed with the patient the benefits, risks, side-effects, alternatives, likelihood of achieving goals, and potential problems during recovery for the procedure that I have provided informed consent. Date  Time: 04/29/2022 12:44 PM  Pre-Procedure Preparation:  Monitoring: As per clinic protocol. Respiration, ETCO2, SpO2, BP, heart rate and rhythm monitor placed and checked for adequate function Safety Precautions: Patient was assessed for positional comfort and pressure points before starting the procedure. Time-out: I initiated and conducted the "Time-out" before starting the procedure, as per protocol. The patient was asked to participate by confirming the accuracy of the "Time Out" information. Verification of the correct person, site, and procedure were performed and confirmed by me, the nursing staff, and the patient. "Time-out" conducted as per Joint Commission's Universal Protocol (UP.01.01.01). Time: 1329  Description/Narrative of Procedure:          Target: Epidural space via interlaminar opening, initially targeting the lower laminar border of the superior vertebral body. Region: Lumbar Approach: Percutaneous paravertebral  Rationale (medical necessity): procedure needed and proper for the diagnosis and/or treatment of the patient's medical symptoms and needs. Procedural Technique Safety Precautions: Aspiration looking for blood return was conducted prior to all injections. At no point did we  inject any substances, as a needle was being advanced. No attempts were made at seeking any paresthesias. Safe injection practices and needle disposal techniques used. Medications properly checked for expiration dates. SDV (single dose vial) medications used. Description of the Procedure: Protocol guidelines were followed. The procedure needle was introduced through the skin, ipsilateral to  the reported pain, and advanced to the target area. Bone was contacted and the needle walked caudad, until the lamina was cleared. The epidural space was identified using "loss-of-resistance technique" with 2-3 ml of PF-NaCl (0.9% NSS), in a 5cc LOR glass syringe.  5cc solution made of 2 cc of preservative-free saline, 2 cc of 0.2% ropivacaine, 1 cc of Decadron 10 mg/cc.   Vitals:   04/29/22 1251 04/29/22 1321 04/29/22 1328 04/29/22 1332  BP: (!) 135/123 (!) 153/86 (!) 163/92 (!) 141/84  Pulse: 72     Resp: '17 18 18 18  '$ Temp: 97.7 F (36.5 C)     TempSrc: Temporal     SpO2: 97% 100% 99% 96%  Weight: 142 lb (64.4 kg)     Height: '5\' 2"'$  (1.575 m)       Start Time: 1329 hrs. End Time: 1332 hrs.  Imaging Guidance (Spinal):          Type of Imaging Technique: Fluoroscopy Guidance (Spinal) Indication(s): Assistance in needle guidance and placement for procedures requiring needle placement in or near specific anatomical locations not easily accessible without such assistance. Exposure Time: Please see nurses notes. Contrast: Before injecting any contrast, we confirmed that the patient did not have an allergy to iodine, shellfish, or radiological contrast. Once satisfactory needle placement was completed at the desired level, radiological contrast was injected. Contrast injected under live fluoroscopy. No contrast complications. See chart for type and volume of contrast used. Fluoroscopic Guidance: I was personally present during the use of fluoroscopy. "Tunnel Vision Technique" used to obtain the best possible  view of the target area. Parallax error corrected before commencing the procedure. "Direction-depth-direction" technique used to introduce the needle under continuous pulsed fluoroscopy. Once target was reached, antero-posterior, oblique, and lateral fluoroscopic projection used confirm needle placement in all planes. Images permanently stored in EMR. Interpretation: I personally interpreted the imaging intraoperatively. Adequate needle placement confirmed in multiple planes. Appropriate spread of contrast into desired area was observed. No evidence of afferent or efferent intravascular uptake. No intrathecal or subarachnoid spread observed. Permanent images saved into the patient's record.  Antibiotic Prophylaxis:   Anti-infectives (From admission, onward)    None      Indication(s): None identified  Post-operative Assessment:  Post-procedure Vital Signs:  Pulse/HCG Rate: 7297 Temp: 97.7 F (36.5 C) Resp: 18 BP: (!) 141/84 SpO2: 96 %  EBL: None  Complications: No immediate post-treatment complications observed by team, or reported by patient.  Note: The patient tolerated the entire procedure well. A repeat set of vitals were taken after the procedure and the patient was kept under observation following institutional policy, for this type of procedure. Post-procedural neurological assessment was performed, showing return to baseline, prior to discharge. The patient was provided with post-procedure discharge instructions, including a section on how to identify potential problems. Should any problems arise concerning this procedure, the patient was given instructions to immediately contact us, at any time, without hesitation. In any case, we plan to contact the patient by telephone for a follow-up status report regarding this interventional procedure.  Comments:  No additional relevant information.  Plan of Care  Orders:  Orders Placed This Encounter  Procedures   DG [facility] C-ARM  1-60 MIN NO REPORT    Intraoperative interpretation by procedural physician at [facility].    Standing Status:   Standing    Number of Occurrences:   1    Order Specific Question:   Reason for exam:  Answer:   Assistance in needle guidance and placement for procedures requiring needle placement in or near specific anatomical locations not easily accessible without such assistance.     Medications ordered for procedure: Meds ordered this encounter  Medications   iohexol (OMNIPAQUE) 180 MG/ML injection 10 mL    Must be Myelogram-compatible. If not available, you may substitute with a water-soluble, non-ionic, hypoallergenic, myelogram-compatible radiological contrast medium.   lidocaine (XYLOCAINE) 2 % (with pres) injection 400 mg   sodium chloride flush (NS) 0.9 % injection 2 mL   ropivacaine (PF) 2 mg/mL (0.2%) (NAROPIN) injection 2 mL   dexamethasone (DECADRON) injection 10 mg   Medications administered: We administered iohexol, lidocaine, sodium chloride flush, ropivacaine (PF) 2 mg/mL (0.2%), and dexamethasone.  See the medical record for exact dosing, route, and time of administration.  Follow-up plan:   Return in about 6 weeks (around 06/10/2022) for Post Procedure Evaluation, in person.      Right L4-L5 ESI 04/29/2022  Recent Visits Date Type Provider Dept  04/22/22 Office Visit Gillis Santa, MD [facility]  Showing recent visits within past 90 days and meeting all other requirements Today's Visits Date Type Provider Dept  04/29/22 Procedure visit Gillis Santa, MD [facility]  Showing today's visits and meeting all other requirements Future Appointments Date Type Provider Dept  06/17/22 Appointment Gillis Santa, MD [facility]  Showing future appointments within next 90 days and meeting all other requirements  Disposition: Discharge home  Discharge (Date  Time): 04/29/2022; 1345 hrs.   Primary Care Physician: Sofie Hartigan, MD Location: [facility] Note by: Gillis Santa, MD Date: 04/29/2022; Time: 3:06 PM  Disclaimer:  Medicine is not an exact science. The only guarantee in medicine is that nothing is guaranteed. It is important to note that the decision to proceed with this intervention was based on the information collected from the patient. The Data and conclusions were drawn from the patient's questionnaire, the interview, and the physical examination. Because the information was provided in large part by the patient, it cannot be guaranteed that it has not been purposely or unconsciously manipulated. Every effort has been made to obtain as much relevant data as possible for this evaluation. It is important to note that the conclusions that lead to this procedure are derived in large part from the available data. Always take into account that the treatment will also be dependent on availability of resources and existing treatment guidelines, considered by other Pain Management Practitioners as being common knowledge and practice, at the time of the intervention. For Medico-Legal purposes, it is also important to point out that variation in procedural techniques and pharmacological choices are the acceptable norm. The indications, contraindications, technique, and results of the above procedure should only be interpreted and judged by a Board-Certified Interventional Pain Specialist with extensive familiarity and expertise in the same exact procedure and technique.

## 2022-04-30 ENCOUNTER — Telehealth: Payer: Self-pay

## 2022-05-07 ENCOUNTER — Ambulatory Visit: Payer: Medicare HMO | Admitting: Neurosurgery

## 2022-06-17 ENCOUNTER — Encounter: Payer: Self-pay | Admitting: Student in an Organized Health Care Education/Training Program

## 2022-06-17 ENCOUNTER — Ambulatory Visit (HOSPITAL_BASED_OUTPATIENT_CLINIC_OR_DEPARTMENT_OTHER): Payer: Medicare HMO | Admitting: Student in an Organized Health Care Education/Training Program

## 2022-06-17 DIAGNOSIS — G894 Chronic pain syndrome: Secondary | ICD-10-CM | POA: Diagnosis not present

## 2022-06-17 DIAGNOSIS — M48062 Spinal stenosis, lumbar region with neurogenic claudication: Secondary | ICD-10-CM

## 2022-06-17 DIAGNOSIS — M48061 Spinal stenosis, lumbar region without neurogenic claudication: Secondary | ICD-10-CM

## 2022-06-17 DIAGNOSIS — M5416 Radiculopathy, lumbar region: Secondary | ICD-10-CM | POA: Diagnosis not present

## 2022-06-17 NOTE — Progress Notes (Signed)
Patient: Victoria Prince  Service Category: E/M  Provider: Gillis Santa, MD  DOB: 11/10/1928  DOS: 06/17/2022  Location: Office  MRN: [id number]  Setting: Ambulatory outpatient  Referring Provider: Sofie Hartigan, MD  Type: Established Patient  Specialty: Interventional Pain Management  PCP: Sofie Hartigan, MD  Location: Remote location  Delivery: TeleHealth     Virtual Encounter - Pain Management PROVIDER NOTE: Information contained herein reflects review and annotations entered in association with encounter. Interpretation of such information and data should be left to medically-trained personnel. Information provided to patient can be located elsewhere in the medical record under "Patient Instructions". Document created using STT-dictation technology, any transcriptional errors that may result from process are unintentional.    Contact & Pharmacy Preferred: [phone] Home: [phone] (home) Mobile: [phone] (mobile) E-mail: [email or web address]  [id number] [place] [id number]: [phone]: [phone]  Pre-screening  Ms. FMargart Sicklesoffered "in-person" vs "virtual" encounter. She indicated preferring virtual for this encounter.   Reason COVID-19*  Social distancing based on CDC and AMA recommendations.   I contacted Victoria Coryon 06/17/2022 via telephone.      I clearly identified myself as BGillis Santa MD. I verified that I was speaking with the correct person using two identifiers (Name: BKILEEN Prince and date of birth: 5Jan 10, 1930.  Consent I sought verbal advanced consent from Victoria Coryfor virtual visit interactions. I informed [name] possible security and privacy concerns, risks, and limitations associated with providing "not-in-person" medical evaluation and management services. I also informed Ms. FReisof the availability of "in-person" appointments. Finally, I  informed her that there would be a charge for the virtual visit and that she could be  personally, fully or partially, financially responsible for it. Ms. FSeebeckexpressed understanding and agreed to proceed.   Historic Elements   Ms. BDACY ENRICOis a 88y.o. year old, female patient evaluated today after our last contact on 04/29/2022. Victoria [place] has a [place] Personal history of chemotherapy (2011), Personal history of radiation therapy (2011), Radiation (2011), and S/P chemotherapy, time since greater than 12 weeks (2011). She also  has a past surgical history that includes Breast biopsy (Left, 2012); Breast biopsy (Left, 2011); Breast biopsy (Right, 12/26/2019); and Skin lesion excision (06/2021). Ms. FPlochhas a current medication list which includes the following prescription(s): acetaminophen, amitriptyline, amlodipine, cetirizine, nystatin, oxycodone, pravastatin, pregabalin, senna-docusate, triamcinolone cream, and naproxen sodium. She  reports that she has never smoked. She has never used smokeless tobacco. She reports that she does not drink alcohol and does not use drugs. Ms. FMolockis allergic to sulfa antibiotics.  Estimated body mass index is 25.97 kg/m as calculated from the following:   Height as of 04/29/22: _1  (1.575 m).   Weight as of 04/29/22: 142 lb (64.4 kg).  HPI  Today, she is being contacted for a post-procedure assessment.   Post-procedure evaluation   Type: Lumbar epidural steroid injection (LESI) (interlaminar) #1    Laterality: Right   Level:  L4-5 Level.  Imaging: Fluoroscopic guidance Spinal (([id number] Anesthesia: Local anesthesia (1-2% Lidocaine) DOS: 04/29/2022  Performed by: BGillis Santa MD  Purpose: Diagnostic/Therapeutic Indications: Lumbar radicular pain of intraspinal etiology of more than 4 weeks that has failed to respond to conservative therapy and is severe enough to impact quality of life or  function. 1. Neurogenic claudication due  to lumbar spinal stenosis   2. Spinal stenosis of lumbar region without neurogenic claudication   3. Lumbar radiculopathy (right L4/5)   4. Chronic pain syndrome    NAS-11 Pain score:   Pre-procedure: 4 /10   Post-procedure: 6 /10      Effectiveness:  Initial hour after procedure: 100 %  Subsequent 4-6 hours post-procedure: 100 %  Analgesia past initial 6 hours: 100 %  Ongoing improvement:  Analgesic:  100% Function: Victoria Prince reports improvement in function ROM: Victoria Prince reports improvement in ROM   Laboratory Chemistry Profile   Renal Lab Results  Component Value Date   BUN 29 (H) 03/11/2022   CREATININE 1.06 (H) 03/11/2022   GFRAA 41 (L) 12/13/2019   GFRNONAA 50 (L) 03/11/2022    Hepatic Lab Results  Component Value Date   AST 30 12/30/2021   ALT 24 12/30/2021   ALBUMIN 4.0 12/30/2021   ALKPHOS 53 12/30/2021    Electrolytes Lab Results  Component Value Date   NA 139 03/11/2022   K 4.7 03/11/2022   CL 107 03/11/2022   CALCIUM 9.4 03/11/2022   MG 2.5 (H) 03/11/2022    Bone No results found for: "VD25OH", "VD125OH2TOT", "VF4734YZ7", "QD6438VK1", "25OHVITD1", "25OHVITD2", "84CRFVOH6", "TESTOFREE", "TESTOSTERONE"  Inflammation (CRP: Acute Phase) (ESR: Chronic Phase) Lab Results  Component Value Date   ESRSEDRATE 12 04/18/2014         Note: Above Lab results reviewed.  Imaging  DG PAIN CLINIC C-ARM 1-60 MIN NO REPORT Fluoro was used, but no Radiologist interpretation will be provided.  Please refer to "NOTES" tab for provider progress note.  Assessment  The primary encounter diagnosis was Neurogenic claudication due to lumbar spinal stenosis. Diagnoses of Spinal stenosis of lumbar region without neurogenic claudication, Lumbar radiculopathy (right L4/5), and Chronic pain syndrome were also pertinent to this visit.  Plan of Care  Excellent response to right L4-L5 epidural steroid injection.  Patient endorses  100% reduction in her pain and improvement in her ability to ambulate as well.  I encouraged her to monitor her symptoms and contact us if her pain returns or if she has a decrease in her functional ability so that we can consider repeating lumbar ESI.  Patient endorsed understanding.    Follow-up plan:   Return if symptoms worsen or fail to improve.     Right L4-L5 ESI 04/29/2022: 100% pain relief, ongoing   Recent Visits Date Type Provider Dept  04/29/22 Procedure visit Gillis Santa, MD [facility]  04/22/22 Office Visit Gillis Santa, MD [facility]  Showing recent visits within past 90 days and meeting all other requirements Today's Visits Date Type Provider Dept  06/17/22 Office Visit Gillis Santa, MD [facility]  Showing today's visits and meeting all other requirements Future Appointments No visits were found meeting these conditions. Showing future appointments within next 90 days and meeting all other requirements  I discussed the assessment and treatment plan with the patient. The patient was provided an opportunity to ask questions and all were answered. The patient agreed with the plan and demonstrated an understanding of the instructions.  Patient advised to call back or seek an in-person evaluation if the symptoms or condition worsens.  Duration of encounter: 48mnutes.  Note by: BGillis Santa MD Date: 06/17/2022; Time: 1:26 PM

## 2022-07-09 ENCOUNTER — Ambulatory Visit: Payer: Medicare HMO | Admitting: Neurosurgery

## 2022-07-10 IMAGING — MG MM DIGITAL SCREENING BILAT W/ TOMO AND CAD
6 of 10 series · 6 of 30 positions shown · non-contrast
Comparison: Previous exam(s).

CLINICAL DATA: Screening.

EXAM:
DIGITAL SCREENING BILATERAL MAMMOGRAM WITH TOMOSYNTHESIS AND CAD
TECHNIQUE: Bilateral screening digital craniocaudal and mediolateral oblique
mammograms were obtained. Bilateral screening digital breast
tomosynthesis was performed. The images were evaluated with
computer-aided detection.

[L MLO synth-2D]
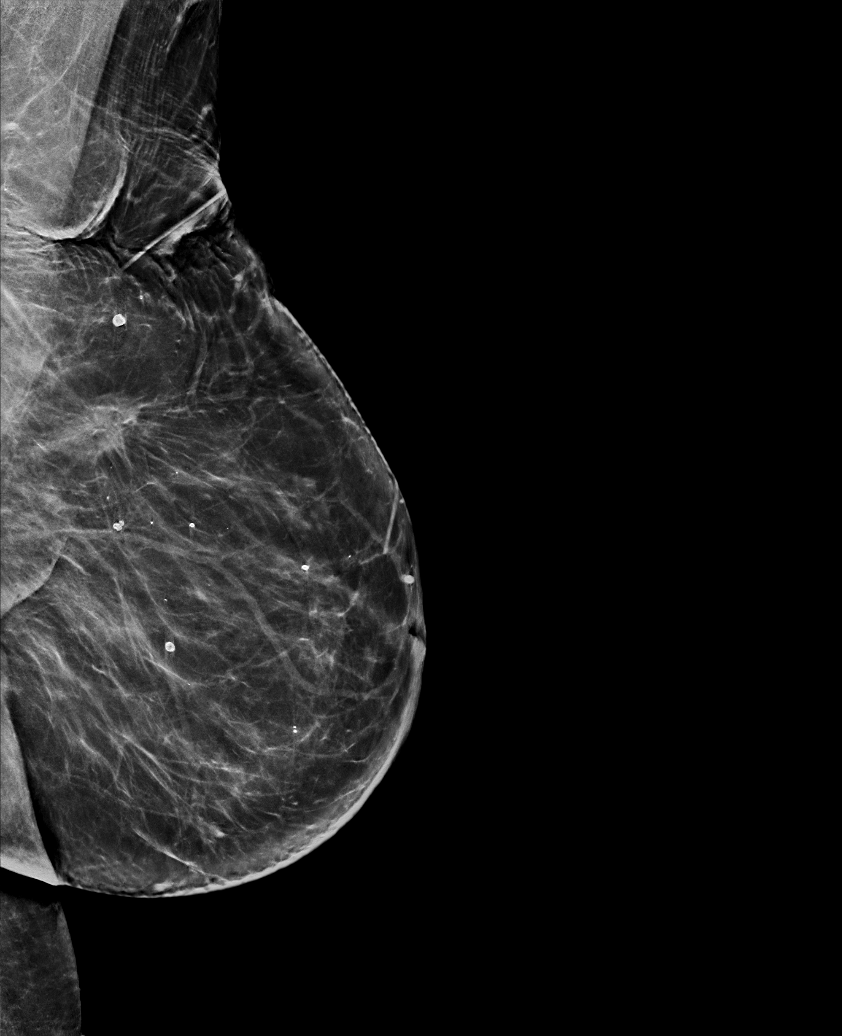

[L CC synth-2D]
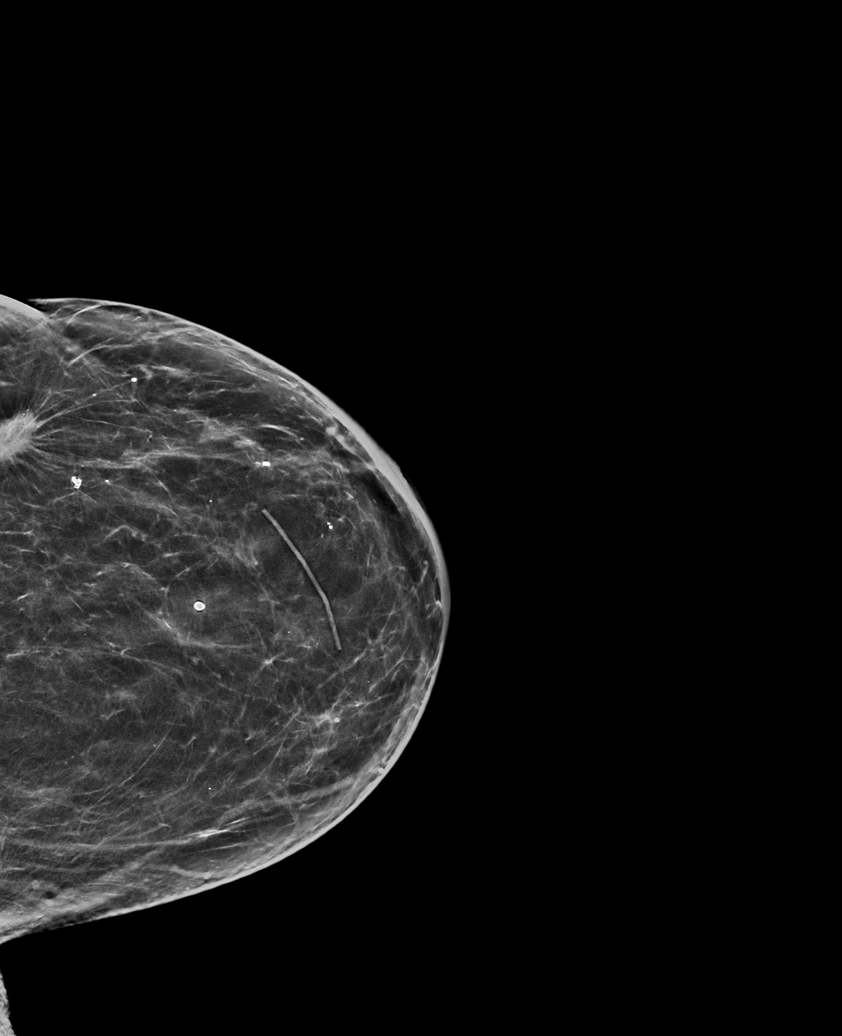

[R CC synth-2D]
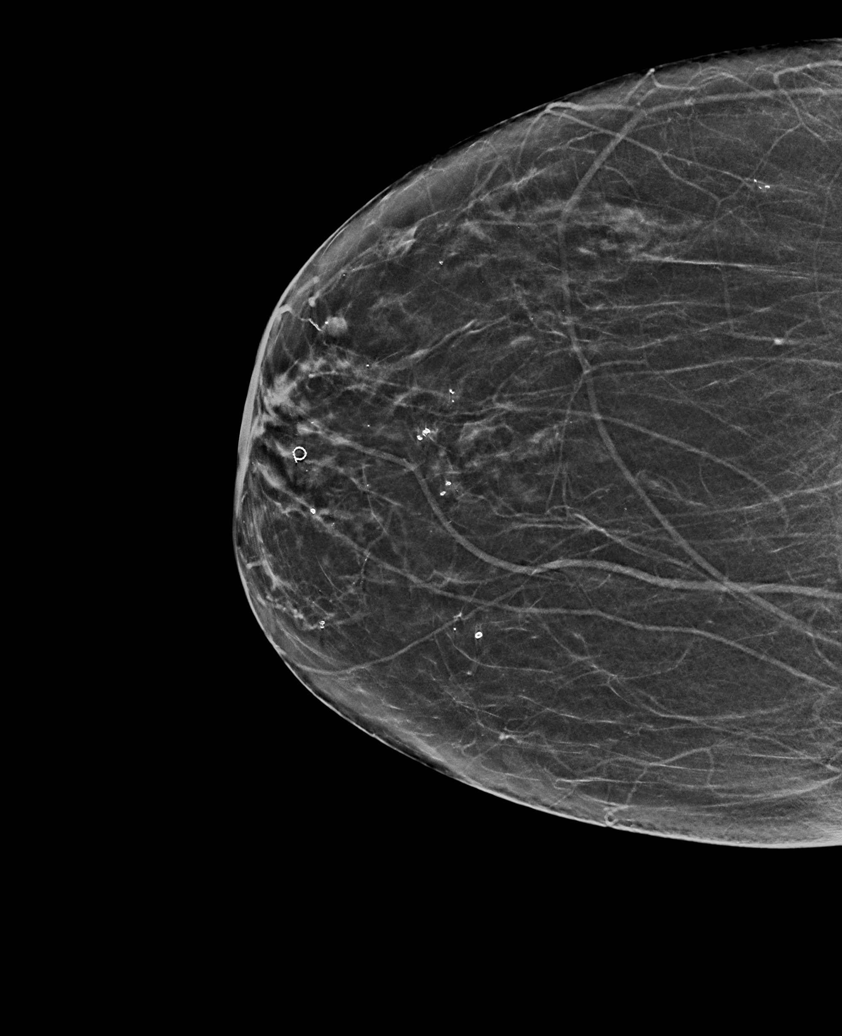

[R MLO synth-2D (1 of 2)]
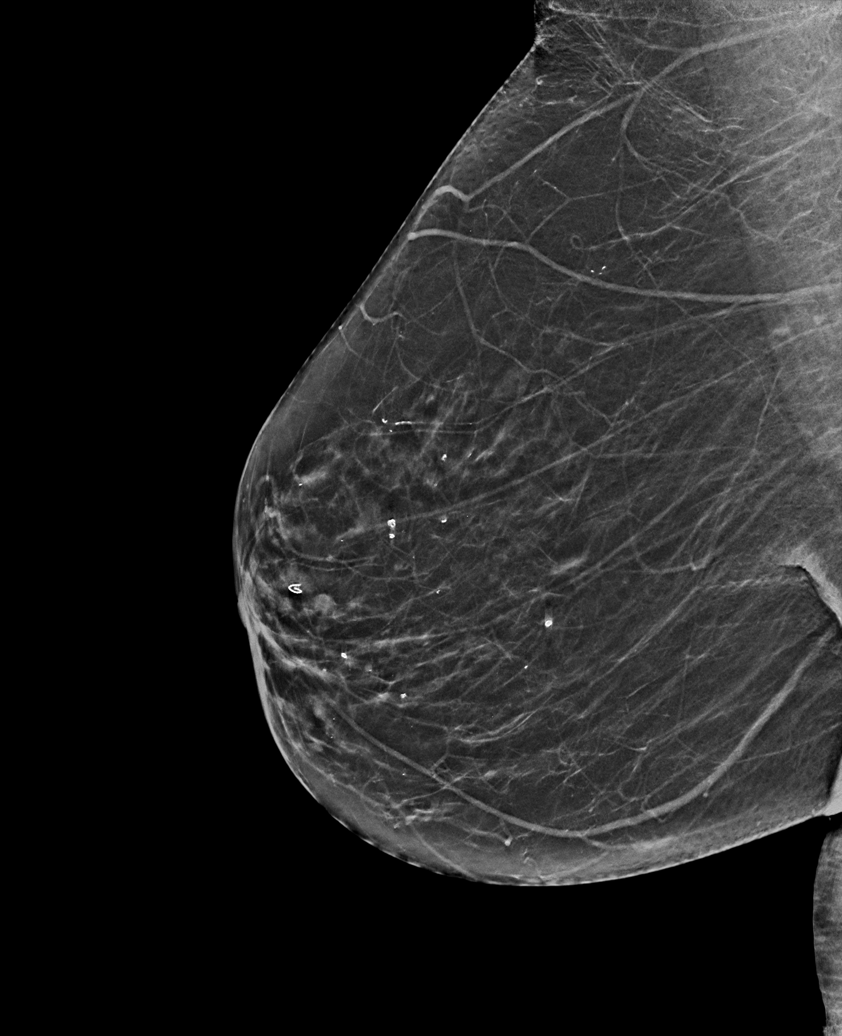

[R MLO synth-2D (2 of 2)]
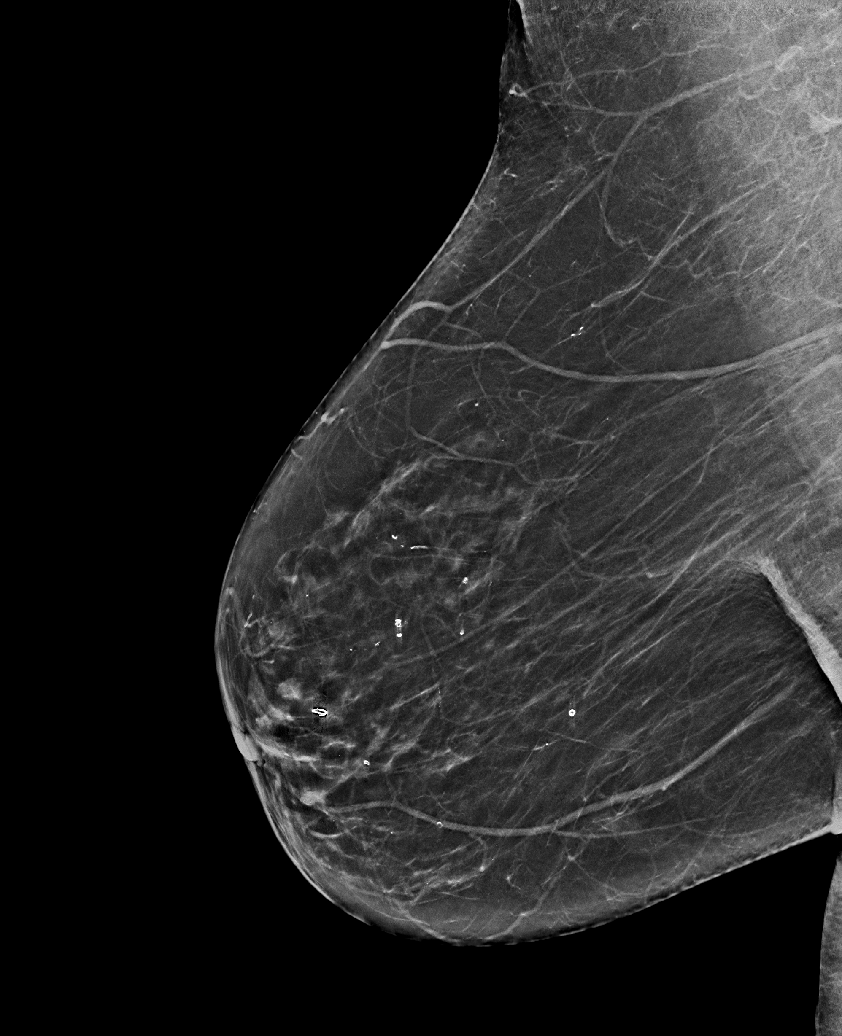

[R MLO tomo · tomo slice 29/58.0]
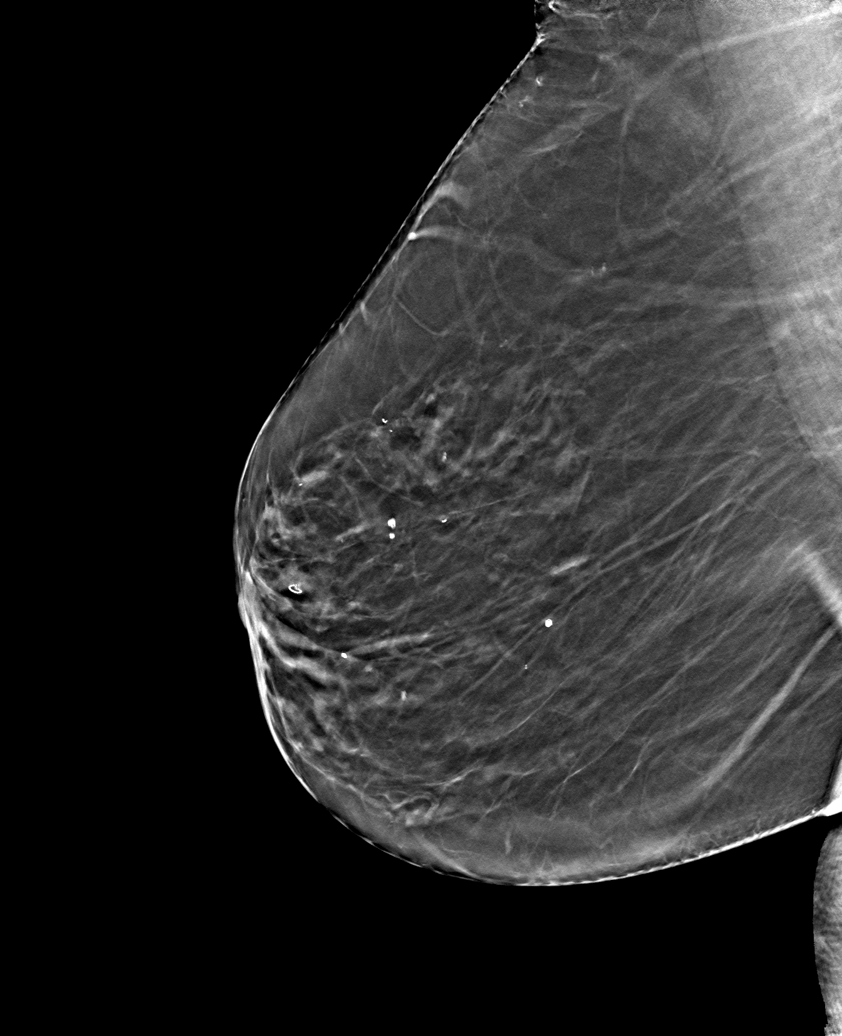

[6 of 30 positions shown; findings below may reference images not displayed]

ACR Breast Density Category b: There are scattered areas of
fibroglandular density.
FINDINGS: There are no findings suspicious for malignancy.
IMPRESSION: No mammographic evidence of malignancy. A result letter of this
screening mammogram will be mailed directly to the patient.

RECOMMENDATION:
Screening mammogram in one year. (Code:51-O-LD2)

BI-RADS CATEGORY  1: Negative.

## 2022-11-18 ENCOUNTER — Other Ambulatory Visit: Payer: Self-pay | Admitting: Family Medicine

## 2022-11-18 DIAGNOSIS — Z1231 Encounter for screening mammogram for malignant neoplasm of breast: Secondary | ICD-10-CM

## 2022-12-28 ENCOUNTER — Ambulatory Visit
Admission: RE | Admit: 2022-12-28 | Discharge: 2022-12-28 | Disposition: A | Discharge status: Home / Self Care | Source: Ambulatory Visit | Attending: Family Medicine | Admitting: Family Medicine

## 2022-12-28 DIAGNOSIS — Z1231 Encounter for screening mammogram for malignant neoplasm of breast: Secondary | ICD-10-CM | POA: Diagnosis not present

## 2022-12-29 ENCOUNTER — Inpatient Hospital Stay (HOSPITAL_BASED_OUTPATIENT_CLINIC_OR_DEPARTMENT_OTHER): Admitting: Internal Medicine

## 2022-12-29 ENCOUNTER — Inpatient Hospital Stay: Payer: Self-pay | Attending: Internal Medicine

## 2022-12-29 ENCOUNTER — Other Ambulatory Visit: Payer: Self-pay | Admitting: *Deleted

## 2022-12-29 VITALS — BP 141/70 | HR 71 | Temp 97.6°F | Wt 143.6 lb

## 2022-12-29 DIAGNOSIS — Z9221 Personal history of antineoplastic chemotherapy: Secondary | ICD-10-CM | POA: Diagnosis not present

## 2022-12-29 DIAGNOSIS — Z1231 Encounter for screening mammogram for malignant neoplasm of breast: Secondary | ICD-10-CM | POA: Diagnosis not present

## 2022-12-29 DIAGNOSIS — M810 Age-related osteoporosis without current pathological fracture: Secondary | ICD-10-CM

## 2022-12-29 DIAGNOSIS — Z923 Personal history of irradiation: Secondary | ICD-10-CM | POA: Insufficient documentation

## 2022-12-29 DIAGNOSIS — C50412 Malignant neoplasm of upper-outer quadrant of left female breast: Secondary | ICD-10-CM

## 2022-12-29 DIAGNOSIS — Z853 Personal history of malignant neoplasm of breast: Secondary | ICD-10-CM | POA: Insufficient documentation

## 2022-12-29 DIAGNOSIS — Z170 Estrogen receptor positive status [ER+]: Secondary | ICD-10-CM

## 2022-12-29 DIAGNOSIS — C50511 Malignant neoplasm of lower-outer quadrant of right female breast: Secondary | ICD-10-CM | POA: Insufficient documentation

## 2022-12-29 DIAGNOSIS — F03B0 Unspecified dementia, moderate, without behavioral disturbance, psychotic disturbance, mood disturbance, and anxiety: Secondary | ICD-10-CM | POA: Insufficient documentation

## 2022-12-29 DIAGNOSIS — M549 Dorsalgia, unspecified: Secondary | ICD-10-CM | POA: Insufficient documentation

## 2022-12-29 DIAGNOSIS — R928 Other abnormal and inconclusive findings on diagnostic imaging of breast: Secondary | ICD-10-CM

## 2022-12-29 LAB — CBC WITH DIFFERENTIAL/PLATELET
Abs Immature Granulocytes: 0 10*3/uL (ref 0.00–0.07)
Basophils Absolute: 0 10*3/uL (ref 0.0–0.1)
Basophils Relative: 0 %
Eosinophils Absolute: 0.1 10*3/uL (ref 0.0–0.5)
Eosinophils Relative: 2 %
HCT: 43.3 % (ref 36.0–46.0)
Hemoglobin: 14.2 g/dL (ref 12.0–15.0)
Immature Granulocytes: 0 %
Lymphocytes Relative: 54 %
Lymphs Abs: 2.5 10*3/uL (ref 0.7–4.0)
MCH: 32.6 pg (ref 26.0–34.0)
MCHC: 32.8 g/dL (ref 30.0–36.0)
MCV: 99.5 fL (ref 80.0–100.0)
Monocytes Absolute: 0.4 10*3/uL (ref 0.1–1.0)
Monocytes Relative: 8 %
Neutro Abs: 1.7 10*3/uL (ref 1.7–7.7)
Neutrophils Relative %: 36 %
Platelets: 173 10*3/uL (ref 150–400)
RBC: 4.35 MIL/uL (ref 3.87–5.11)
RDW: 13.8 % (ref 11.5–15.5)
WBC: 4.7 10*3/uL (ref 4.0–10.5)
nRBC: 0 % (ref 0.0–0.2)

## 2022-12-29 LAB — COMPREHENSIVE METABOLIC PANEL
ALT: 13 U/L (ref 0–44)
AST: 19 U/L (ref 15–41)
Albumin: 4 g/dL (ref 3.5–5.0)
Alkaline Phosphatase: 62 U/L (ref 38–126)
Anion gap: 7 (ref 5–15)
BUN: 18 mg/dL (ref 8–23)
CO2: 27 mmol/L (ref 22–32)
Calcium: 9.4 mg/dL (ref 8.9–10.3)
Chloride: 106 mmol/L (ref 98–111)
Creatinine, Ser: 1.13 mg/dL — ABNORMAL HIGH (ref 0.44–1.00)
GFR, Estimated: 46 mL/min — ABNORMAL LOW (ref >60)
Glucose, Bld: 93 mg/dL (ref 70–99)
Potassium: 4.6 mmol/L (ref 3.5–5.1)
Sodium: 140 mmol/L (ref 135–145)
Total Bilirubin: 0.4 mg/dL (ref 0.3–1.2)
Total Protein: 7 g/dL (ref 6.5–8.1)

## 2022-12-29 NOTE — Progress Notes (Signed)
[facility] CONSULT NOTE  Patient Care Team: Marina Goodell, MD as PCP - General (Family Medicine) Rosey Bath, MD (Inactive) as Referring Physician (Hematology and Oncology) Earna Coder, MD as Consulting Physician (Oncology)  CHIEF COMPLAINTS/PURPOSE OF CONSULTATION: Breast cancer  #  Oncology History Overview Note  # 2011-  Carcinoma of breast status post lumpectomyestrogen receptor positive, progesterone receptor positive.  Positive margin with ductal carcinoma in situ. [Dr.Smith] 2. Oncotype DX score is 28 with recurring score is high in between 14%-22% on average 18 %. 3. Cytoxan and Taxotere for 4 cycle 4. Followed by radiation therapy finished in December of 2011  5. Started on Femara.  December of 2011 6. Changed to Arimidex (insurance request) 7/finishing Arimidex  in May of 2016     Cancer of breast [facility])  Carcinoma of upper-outer quadrant of left breast in female, estrogen receptor positive (HCC)  01/20/2016 Initial Diagnosis   Malignant neoplasm of upper-outer quadrant of left female breast (HCC)     HISTORY OF PRESENTING ILLNESS: Accompanied by her son.  Ambulating independently.  Victoria Prince 89 y.o.  female with a history of stage I ER/PR positive breast cancer currently on surveillance is here for follow-up/review results of the annual surveillance mammogram.  Mammogram has not resulted yet.  Overall she has been feeling well.  Denies any palpable masses.  Has issues with pinching at home which are better controlled with epidural steroid injections.  Denies any recent illnesses.   Review of Systems  Constitutional:  Negative for chills, diaphoresis, fever, malaise/fatigue and weight loss.  HENT:  Negative for nosebleeds and sore throat.   Eyes:  Negative for double vision.  Respiratory:  Negative for cough, hemoptysis, sputum production, shortness of breath and wheezing.   Cardiovascular:  Negative for chest pain,  palpitations, orthopnea and leg swelling.  Gastrointestinal:  Negative for abdominal pain, blood in stool, constipation, diarrhea, heartburn, melena, nausea and vomiting.  Genitourinary:  Negative for dysuria, frequency and urgency.  Musculoskeletal:  Positive for joint pain. Negative for back pain.  Skin: Negative.  Negative for itching and rash.  Neurological:  Negative for dizziness, tingling, focal weakness, weakness and headaches.  Endo/Heme/Allergies:  Does not bruise/bleed easily.  Psychiatric/Behavioral:  Negative for depression. The patient is not nervous/anxious and does not have insomnia.      MEDICAL HISTORY:  Past Medical History:  Diagnosis Date   Breast cancer (HCC) 2011   left breast lumpectomy with rad and chemo tx   Personal history of chemotherapy 2011   left breast   Personal history of radiation therapy 2011   left breast   Radiation 2011   S/P chemotherapy, time since greater than 12 weeks 2011    SURGICAL HISTORY: Past Surgical History:  Procedure Laterality Date   BREAST BIOPSY Left 2012   negative   BREAST BIOPSY Left 2011   2 areas removed. Retroaerola area benign.  UOQ was DCIS with positive margin per Dr. Donneta Romberg notes.    BREAST BIOPSY Right 12/26/2019   [place] bx, Q marker, path pending   BREAST LUMPECTOMY Left    SKIN LESION EXCISION  06/2021    SOCIAL HISTORY: Social History   Socioeconomic History   Marital status: Married    Spouse name: Not on file   Number of children: Not on file   Years of education: Not on file   Highest education level: Not on file  Occupational History   Not on file  Tobacco Use  Smoking status: Never   Smokeless tobacco: Never  Vaping Use   Vaping Use: Never used  Substance and Sexual Activity   Alcohol use: Never   Drug use: Never   Sexual activity: Not on file  Other Topics Concern   Not on file  Social History Narrative   Not on file   Social Determinants of Health   Financial Resource Strain:  Not on file  Food Insecurity: No Food Insecurity (03/09/2022)   Hunger Vital Sign    Worried About Running Out of Food in the Last Year: Never true    Ran Out of Food in the Last Year: Never true  Transportation Needs: No Transportation Needs (03/09/2022)   PRAPARE - Administrator, Civil Service (Medical): No    Lack of Transportation (Non-Medical): No  Physical Activity: Not on file  Stress: Not on file  Social Connections: Not on file  Intimate Partner Violence: Not At Risk (03/09/2022)   Humiliation, Afraid, Rape, and Kick questionnaire    Fear of Current or Ex-Partner: No    Emotionally Abused: No    Physically Abused: No    Sexually Abused: No    FAMILY HISTORY: Family History  Problem Relation Age of Onset   Breast cancer Neg Hx     ALLERGIES:  is allergic to sulfa antibiotics.  MEDICATIONS:  Current Outpatient Medications  Medication Sig Dispense Refill   acetaminophen (TYLENOL) 325 MG tablet Take 650 mg by mouth every 6 (six) hours as needed.     amitriptyline (ELAVIL) 25 MG tablet Take 25 mg by mouth at bedtime.      amLODipine (NORVASC) 2.5 MG tablet Take 2.5 mg by mouth daily.      cetirizine (ZYRTEC) 10 MG tablet Take 1 tablet by mouth daily.     LORazepam (ATIVAN) 0.5 MG tablet Take 0.5 mg by mouth every 8 (eight) hours.     naproxen sodium (ALEVE) 220 MG tablet Take 220 mg by mouth daily as needed (pain).     oxyCODONE (OXY IR/ROXICODONE) 5 MG immediate release tablet Take 1 tablet (5 mg total) by mouth every 6 (six) hours as needed for moderate pain. 10 tablet 0   pregabalin (LYRICA) 25 MG capsule Take 1 capsule (25 mg total) by mouth at bedtime. 30 capsule 1   senna-docusate (SENOKOT-S) 8.6-50 MG tablet Take 1 tablet by mouth at bedtime as needed for mild constipation. 30 tablet 0   nystatin powder Apply 1 Application topically 2 (two) times daily. (Patient not taking: Reported on 12/29/2022)     pravastatin (PRAVACHOL) 40 MG tablet Take 40 mg by mouth  daily.  (Patient not taking: Reported on 12/29/2022)     triamcinolone cream (KENALOG) 0.5 % Apply topically as directed.     No current facility-administered medications for this visit.      Marland Kitchen  PHYSICAL EXAMINATION: ECOG PERFORMANCE STATUS: 0 - Asymptomatic  Vitals:   12/29/22 1012  BP: (!) 141/70  Pulse: 71  Temp: 97.6 F (36.4 C)  SpO2: 100%   Filed Weights   12/29/22 1012  Weight: 143 lb 9.6 oz (65.1 kg)    Physical Exam Vitals and nursing note reviewed.  Constitutional:      Comments: Ambulating: independently with a walker wheelchair  Alone with family  HENT:     Head: Normocephalic and atraumatic.     Mouth/Throat:     Pharynx: Oropharynx is clear.  Eyes:     Extraocular Movements: Extraocular movements intact.  Pupils: Pupils are equal, round, and reactive to light.  Cardiovascular:     Rate and Rhythm: Normal rate and regular rhythm.  Pulmonary:     Comments: Decreased breath sounds bilaterally.  Abdominal:     Palpations: Abdomen is soft.  Musculoskeletal:        General: Normal range of motion.     Cervical back: Normal range of motion.  Skin:    General: Skin is warm.  Neurological:     General: No focal deficit present.     Mental Status: She is alert and oriented to person, place, and time.  Psychiatric:        Behavior: Behavior normal.        Judgment: Judgment normal.     LABORATORY DATA:  I have reviewed the data as listed Lab Results  Component Value Date   WBC 4.7 12/29/2022   HGB 14.2 12/29/2022   HCT 43.3 12/29/2022   MCV 99.5 12/29/2022   PLT 173 12/29/2022   Recent Labs    12/30/21 0954 02/02/22 1737 03/09/22 1017 03/11/22 0416 12/29/22 0949  NA 138   < > 139 139 140  K 4.1   < > 4.3 4.7 4.6  CL 107   < > 108 107 106  CO2 27   < > 22 26 27   GLUCOSE 84   < > 164* 124* 93  BUN 15   < > 14 29* 18  CREATININE 1.25*   < > 1.06* 1.06* 1.13*  CALCIUM 8.9   < > 9.6 9.4 9.4  GFRNONAA 41*   < > 50* 50* 46*  PROT 6.7   --   --   --  7.0  ALBUMIN 4.0  --   --   --  4.0  AST 30  --   --   --  19  ALT 24  --   --   --  13  ALKPHOS 53  --   --   --  62  BILITOT 0.5  --   --   --  0.4   < > = values in this interval not displayed.     RADIOGRAPHIC STUDIES: I have personally reviewed the radiological images as listed and agreed with the findings in the report. MM 3D SCREENING MAMMOGRAM BILATERAL BREAST  Result Date: 12/29/2022 CLINICAL DATA:  Screening. EXAM: DIGITAL SCREENING BILATERAL MAMMOGRAM WITH TOMOSYNTHESIS AND CAD TECHNIQUE: Bilateral screening digital craniocaudal and mediolateral oblique mammograms were obtained. Bilateral screening digital breast tomosynthesis was performed. The images were evaluated with computer-aided detection. COMPARISON:  Previous exam(s). ACR Breast Density Category b: There are scattered areas of fibroglandular density. FINDINGS: In the right breast, a possible mass warrants further evaluation. In the left breast, no findings suspicious for malignancy. IMPRESSION: Further evaluation is suggested for a possible mass in the right breast. RECOMMENDATION: Diagnostic mammogram and possibly ultrasound of the right breast. (Code:[id number]) The patient will be contacted regarding the findings, and additional imaging will be scheduled. BI-RADS CATEGORY  0: Incomplete: Need additional imaging evaluation. Electronically Signed   By: Frederico Hamman M.D.   On: 12/29/2022 10:55   ASSESSMENT & PLAN:  Carcinoma of upper-outer quadrant of left breast in female, estrogen receptor positive (HCC) # Breast ca er/pr- pos; her 2 neu neg; high risk oncotype s/p TC [2012] s/p arimidex [finished may 2016].  Mammogram results were pending when I saw the patient.  She has been overall doing well.  I later followed up the results and  there is a possible mass in the right breast.  Diagnostic mammogram of the right breast with ultrasound will be ordered.  Will call the patient and inform about the next  steps.   # Bone density OSTEOPOROSIS [2020]- T score= -3.9; JUNE 2023-bone density-T score of -4.1 worsening.  On prior visit, she was advised calcium vitamin D supplements and bisphosphonates.  I reiterated the importance today as well.  Patient has decided to hold off on bisphosphonates.  She feels comfortable starting calcium vitamin D supplements.   # CKD-III- GFR 41-STABLE;  monitor for now- STABLE.    #DISPOSITION: # follow up in 1 year MD; labs- cbc/cmp/bil mammogram-Dr.B  Addendum-will schedule follow-up visit in 2 weeks with Dr. Leonard Schwartz.  Screening mammogram showed possible mass in the right breast.  Will also request for diagnostic right mammogram and ultrasound to further evaluate.   All questions were answered. The patient knows to call the clinic with any problems, questions or concerns.       Michaelyn Barter, MD 12/29/2022 12:48 PM

## 2022-12-29 NOTE — Progress Notes (Signed)
Patient stated that since the last time she was here, (back in September of 2023), that she has developed a pinched nerve located in her Right upper portion of her lower back. When she excerpts herself too much is when the pain is the worse(10).

## 2022-12-31 ENCOUNTER — Other Ambulatory Visit: Payer: Self-pay | Admitting: Internal Medicine

## 2022-12-31 ENCOUNTER — Ambulatory Visit
Admission: RE | Admit: 2022-12-31 | Discharge: 2022-12-31 | Disposition: A | Discharge status: Home / Self Care | Payer: Self-pay | Source: Ambulatory Visit | Attending: Internal Medicine | Admitting: Internal Medicine

## 2022-12-31 DIAGNOSIS — C50412 Malignant neoplasm of upper-outer quadrant of left female breast: Secondary | ICD-10-CM | POA: Insufficient documentation

## 2022-12-31 DIAGNOSIS — N63 Unspecified lump in unspecified breast: Secondary | ICD-10-CM

## 2022-12-31 DIAGNOSIS — Z170 Estrogen receptor positive status [ER+]: Secondary | ICD-10-CM | POA: Insufficient documentation

## 2022-12-31 DIAGNOSIS — R928 Other abnormal and inconclusive findings on diagnostic imaging of breast: Secondary | ICD-10-CM

## 2023-01-06 ENCOUNTER — Ambulatory Visit
Admission: RE | Admit: 2023-01-06 | Discharge: 2023-01-06 | Disposition: A | Discharge status: Home / Self Care | Payer: Self-pay | Source: Ambulatory Visit | Attending: Internal Medicine | Admitting: Internal Medicine

## 2023-01-06 DIAGNOSIS — R928 Other abnormal and inconclusive findings on diagnostic imaging of breast: Secondary | ICD-10-CM | POA: Insufficient documentation

## 2023-01-06 DIAGNOSIS — N63 Unspecified lump in unspecified breast: Secondary | ICD-10-CM

## 2023-01-06 HISTORY — PX: BREAST BIOPSY: SHX20

## 2023-01-06 MED ORDER — LIDOCAINE 1 % OPTIME INJ - NO CHARGE
2.0000 mL | Freq: Once | INTRAMUSCULAR | Status: AC
  Administered 2023-01-06: 2 mL via INTRADERMAL
  Filled 2023-01-06: qty 2

## 2023-01-06 MED ORDER — LIDOCAINE-EPINEPHRINE 1 %-1:100000 IJ SOLN
10.0000 mL | Freq: Once | INTRAMUSCULAR | Status: AC
  Administered 2023-01-06: 10 mL
  Filled 2023-01-06: qty 10

## 2023-01-11 ENCOUNTER — Telehealth: Payer: Self-pay

## 2023-01-11 NOTE — Telephone Encounter (Signed)
Called and spoke with son, Dannielle Huh. They have an appointment with Dr. Donneta Romberg on 7/24. Offered to arrange a surgical referral and he is not sure she will be a candidate for surgery. They will wait until appointment with Dr. Donneta Romberg before referral to surgery is sent. He would like a navigator follow up after appointment.

## 2023-01-13 ENCOUNTER — Encounter: Payer: Self-pay | Admitting: Internal Medicine

## 2023-01-13 ENCOUNTER — Inpatient Hospital Stay: Payer: Self-pay

## 2023-01-13 ENCOUNTER — Inpatient Hospital Stay (HOSPITAL_BASED_OUTPATIENT_CLINIC_OR_DEPARTMENT_OTHER): Admitting: Internal Medicine

## 2023-01-13 VITALS — BP 138/74 | HR 80 | Temp 97.7°F | Resp 16 | Ht 62.0 in | Wt 143.0 lb

## 2023-01-13 DIAGNOSIS — C50511 Malignant neoplasm of lower-outer quadrant of right female breast: Secondary | ICD-10-CM

## 2023-01-13 DIAGNOSIS — Z170 Estrogen receptor positive status [ER+]: Secondary | ICD-10-CM

## 2023-01-13 NOTE — Assessment & Plan Note (Addendum)
#   Right breast cancer stage I- cT1b cN0-ER/PR positive HER2 negative.  History of left breast cancer 2011-status post chemoradiation lumpectomy-followed by endocrine therapy.   # I had a long discussion with the patient in general regarding the treatment options of breast cancer including-surgery; adjuvant radiation; role of adjuvant systemic therapy including-chemotherapy antihormone therapy.    # Given patient's age-chemotherapy; and radiation therapy could be omitted.  # Discussed regarding lumpectomy without sentinel lymph node biopsy.  However patient and son extremely concerned about the potential risk of surgery age and also risk of anesthesia given her dementia/episodes of confusion.  Will defer to Dr. Maia Plan.  Discussed with Dr. Maia Plan.  #I discussed the role of endocrine therapy-given ER/PR positive disease.  Patient is also considering use of antihormone therapy without surgery.  However will currently await evaluation Dr. Maia Plan.  # Back pain/sciatica [ sep 2023- [facility]]- s/p injection-currently improved.  # Mild to moderate dementia/with episodes of confusion especially in the evenings.  Thank you for allowing me to participate in the care of your pleasant patient. Please do not hesitate to contact me with questions or concerns in the interim.   # DISPOSITION: # no labs- # referral to Dr.Cintron re: breast cancer # follow up in 4 weeks- MD: no labs-Dr.B

## 2023-01-13 NOTE — Progress Notes (Signed)
Patient is here today to discuss cancer in right breast and what further needs to be done.

## 2023-01-13 NOTE — Progress Notes (Signed)
[facility] CONSULT NOTE  Patient Care Team: Marina Goodell, MD as PCP - General (Family Medicine) Rosey Bath, MD (Inactive) as Referring Physician (Hematology and Oncology) Earna Coder, MD as Consulting Physician (Oncology)  CHIEF COMPLAINTS/PURPOSE OF CONSULTATION: Breast cancer  #  Oncology History Overview Note  # 2011-  Carcinoma of breast status post lumpectomyestrogen receptor positive, progesterone receptor positive.  Positive margin with ductal carcinoma in situ. [Dr.Smith] 2. Oncotype DX score is 28 with recurring score is high in between 14%-22% on average 18 %. 3. Cytoxan and Taxotere for 4 cycle 4. Followed by radiation therapy finished in December of 2011  5. Started on Femara.  December of 2011 6. Changed to Arimidex (insurance request) 7/finishing Arimidex  in May of 2016 ---------------------   INVASIVE MAMMARY CARCINOMA, SEE NOTE - TUBULE FORMATION: SCORE 3 - NUCLEAR PLEOMORPHISM: SCORE 2 - MITOTIC COUNT: SCORE 1 - TOTAL SCORE: 6 - OVERALL GRADE: 2 - LYMPHOVASCULAR INVASION: NOT IDENTIFIED - CANCER LENGTH: 1.1 CM - CALCIFICATIONS: NOT IDENTIFIED  Targeted right breast ultrasound is performed demonstrating 3 o'clock retroareolar location lobulated hypoechoic solid-appearing mass measuring 1.2 x 0.8 x 0.9 cm. This finding corresponds to the mammographically seen mass      Cancer of breast (HCC)  Carcinoma of upper-outer quadrant of left breast in female, estrogen receptor positive (HCC)  01/20/2016 Initial Diagnosis   Malignant neoplasm of upper-outer quadrant of left female breast (HCC)      HISTORY OF PRESENTING ILLNESS: Patient ambulating- with assistance- cane. Accompanied by family- son.   Wells Nicoletta Ba 89 y.o.  female  prior history of stage I left ER/PR positive HER2 negative breast cancer [2011] in referred to [place] for further evaluation recommendations for new diagnosis of right breast cancer.   Patient  states she was found to have an abnormal screening mammogram which led to diagnostic mammogram/ultrasound/followed by biopsy-as summarized above.  Otherwise patient denies any new lumps or bumps.  Patient family concerned about ongoing mild to moderate dementia.  Dementia/confusion worse at nighttime.  Patient's son lives with the patient.  Review of Systems  Constitutional:  Negative for chills, diaphoresis, fever, malaise/fatigue and weight loss.  HENT:  Negative for nosebleeds and sore throat.   Eyes:  Negative for double vision.  Respiratory:  Negative for cough, hemoptysis, sputum production, shortness of breath and wheezing.   Cardiovascular:  Negative for chest pain, palpitations, orthopnea and leg swelling.  Gastrointestinal:  Negative for abdominal pain, blood in stool, constipation, diarrhea, heartburn, melena, nausea and vomiting.  Genitourinary:  Negative for dysuria, frequency and urgency.  Musculoskeletal:  Positive for back pain and joint pain.  Skin: Negative.  Negative for itching and rash.  Neurological:  Negative for dizziness, tingling, focal weakness, weakness and headaches.  Endo/Heme/Allergies:  Does not bruise/bleed easily.  Psychiatric/Behavioral:  Negative for depression. The patient is not nervous/anxious and does not have insomnia.      MEDICAL HISTORY:  Past Medical History:  Diagnosis Date   Breast cancer (HCC) 2011   left breast lumpectomy with rad and chemo tx   Personal history of chemotherapy 2011   left breast   Personal history of radiation therapy 2011   left breast   Radiation 2011   S/P chemotherapy, time since greater than 12 weeks 2011    SURGICAL HISTORY: Past Surgical History:  Procedure Laterality Date   BREAST BIOPSY Left 2012   negative   BREAST BIOPSY Left 2011   2 areas removed. Retroaerola  area benign.  UOQ was DCIS with positive margin per Dr. Donneta Romberg notes.    BREAST BIOPSY Right 12/26/2019   [place] bx, Q marker, PREDOMINANTLY  SCLEROTIC FIBROUS STROMA WITH RARE ATROPHIC MAMMARY   BREAST BIOPSY Right 01/06/2023   [place] Core Bx. coil clip- path pending   BREAST BIOPSY Right 01/06/2023   [place] RT BREAST BX W LOC DEV 1ST LESION IMG BX SPEC US GUIDE 01/06/2023 [facility]   BREAST LUMPECTOMY Left    SKIN LESION EXCISION  06/2021    SOCIAL HISTORY: Social History   Socioeconomic History   Marital status: Married    Spouse name: Not on file   Number of children: Not on file   Years of education: Not on file   Highest education level: Not on file  Occupational History   Not on file  Tobacco Use   Smoking status: Never   Smokeless tobacco: Never  Vaping Use   Vaping status: Never Used  Substance and Sexual Activity   Alcohol use: Never   Drug use: Never   Sexual activity: Not on file  Other Topics Concern   Not on file  Social History Narrative   Not on file   Social Determinants of Health   Financial Resource Strain: Not on file  Food Insecurity: No Food Insecurity (03/09/2022)   Hunger Vital Sign    Worried About Running Out of Food in the Last Year: Never true    Ran Out of Food in the Last Year: Never true  Transportation Needs: No Transportation Needs (03/09/2022)   PRAPARE - Administrator, Civil Service (Medical): No    Lack of Transportation (Non-Medical): No  Physical Activity: Not on file  Stress: Not on file  Social Connections: Not on file  Intimate Partner Violence: Not At Risk (03/09/2022)   Humiliation, Afraid, Rape, and Kick questionnaire    Fear of Current or Ex-Partner: No    Emotionally Abused: No    Physically Abused: No    Sexually Abused: No    FAMILY HISTORY: Family History  Problem Relation Age of Onset   Breast cancer Neg Hx     ALLERGIES:  is allergic to sulfa antibiotics.  MEDICATIONS:  Current Outpatient Medications  Medication Sig Dispense Refill   acetaminophen (TYLENOL) 325 MG tablet Take 650 mg by mouth every 6 (six) hours as needed.      amitriptyline (ELAVIL) 25 MG tablet Take 25 mg by mouth at bedtime.      amLODipine (NORVASC) 2.5 MG tablet Take 2.5 mg by mouth daily.      cetirizine (ZYRTEC) 10 MG tablet Take 1 tablet by mouth daily.     LORazepam (ATIVAN) 0.5 MG tablet Take 0.5 mg by mouth every 8 (eight) hours.     pregabalin (LYRICA) 25 MG capsule Take 1 capsule (25 mg total) by mouth at bedtime. 30 capsule 1   senna-docusate (SENOKOT-S) 8.6-50 MG tablet Take 1 tablet by mouth at bedtime as needed for mild constipation. 30 tablet 0   triamcinolone cream (KENALOG) 0.5 % Apply topically as directed.     naproxen sodium (ALEVE) 220 MG tablet Take 220 mg by mouth daily as needed (pain). (Patient not taking: Reported on 01/13/2023)     nystatin powder Apply 1 Application topically 2 (two) times daily. (Patient not taking: Reported on 12/29/2022)     oxyCODONE (OXY IR/ROXICODONE) 5 MG immediate release tablet Take 1 tablet (5 mg total) by mouth every 6 (six) hours as needed for  moderate pain. (Patient not taking: Reported on 01/13/2023) 10 tablet 0   pravastatin (PRAVACHOL) 40 MG tablet Take 40 mg by mouth daily.  (Patient not taking: Reported on 12/29/2022)     No current facility-administered medications for this visit.     PHYSICAL EXAMINATION:  Vitals:   01/13/23 1507  BP: 138/74  Pulse: 80  Resp: 16  Temp: 97.7 F (36.5 C)  SpO2: 98%   Filed Weights   01/13/23 1507  Weight: 143 lb (64.9 kg)    Physical Exam Vitals and nursing note reviewed.  HENT:     Head: Normocephalic and atraumatic.     Mouth/Throat:     Pharynx: Oropharynx is clear.  Eyes:     Extraocular Movements: Extraocular movements intact.     Pupils: Pupils are equal, round, and reactive to light.  Cardiovascular:     Rate and Rhythm: Normal rate and regular rhythm.  Pulmonary:     Comments: Decreased breath sounds bilaterally.  Abdominal:     Palpations: Abdomen is soft.  Musculoskeletal:        General: Normal range of motion.      Cervical back: Normal range of motion.  Skin:    General: Skin is warm.  Neurological:     General: No focal deficit present.     Mental Status: She is alert and oriented to person, place, and time.  Psychiatric:        Behavior: Behavior normal.        Judgment: Judgment normal.      LABORATORY DATA:  I have reviewed the data as listed Lab Results  Component Value Date   WBC 4.7 12/29/2022   HGB 14.2 12/29/2022   HCT 43.3 12/29/2022   MCV 99.5 12/29/2022   PLT 173 12/29/2022   Recent Labs    03/09/22 1017 03/11/22 0416 12/29/22 0949  NA 139 139 140  K 4.3 4.7 4.6  CL 108 107 106  CO2 22 26 27   GLUCOSE 164* 124* 93  BUN 14 29* 18  CREATININE 1.06* 1.06* 1.13*  CALCIUM 9.6 9.4 9.4  GFRNONAA 50* 50* 46*  PROT  --   --  7.0  ALBUMIN  --   --  4.0  AST  --   --  19  ALT  --   --  13  ALKPHOS  --   --  62  BILITOT  --   --  0.4    RADIOGRAPHIC STUDIES: I have personally reviewed the radiological images as listed and agreed with the findings in the report. [place] RT BREAST BX W LOC DEV 1ST LESION IMG BX SPEC US GUIDE  Addendum Date: 01/07/2023   ADDENDUM REPORT: 01/07/2023 16:40 ADDENDUM: PATHOLOGY revealed: Breast, right, needle core biopsy, 3 oc, mass retroareolar (coil clip) - INVASIVE MAMMARY CARCINOMA, - OVERALL GRADE: 2. - LYMPHOVASCULAR INVASION: NOT IDENTIFIED. - CANCER LENGTH: 1.1 CM - CALCIFICATIONS: NOT IDENTIFIED. Pathology results are CONCORDANT with imaging findings, per Dr. Jacob Moores. Pathology results and recommendations were discussed with patient and her son Dannielle Huh) via telephone on 01/07/2023. Patient reported biopsy site doing well with no adverse symptoms, and only slight tenderness at the site. Post biopsy care instructions were reviewed, questions were answered and my direct phone number was provided. Patient was instructed to call [facility] for any additional questions or concerns related to biopsy site. RECOMMENDATIONS: 1. Surgical and  oncological consultation. Request for surgical and oncological consultation relayed to Ladora Daniel RN at [facility] by Randa Lynn RN on 01/07/2023. Note: Patient and son Dannielle Huh) also requested to consult with oncologist Dr. Louretta Shorten about treatment plan and possible referral to breast surgeon. In-basket Epic message to Ladora Daniel RN included oncologist on message. Pathology results reported by Randa Lynn RN on 01/07/2023. Electronically Signed   By: Jacob Moores M.D.   On: 01/07/2023 16:40   Result Date: 01/07/2023 CLINICAL DATA:  89 year old female with screen detected mass in the right breast EXAM: ULTRASOUND GUIDED RIGHT BREAST CORE NEEDLE BIOPSY COMPARISON:  Previous exam(s). PROCEDURE: I met with the patient and we discussed the procedure of ultrasound-guided biopsy, including benefits and alternatives. We discussed the high likelihood of a successful procedure. We discussed the risks of the procedure, including infection, bleeding, tissue injury, clip migration, and inadequate sampling. Informed written consent was given. The usual time-out protocol was performed immediately prior to the procedure. Lesion quadrant: Upper inner quadrant Using sterile technique and 1% Lidocaine as local anesthetic, under direct ultrasound visualization, a 14 gauge spring-loaded device was used to perform biopsy of a mass in the right breast 3 o'clock retroareolar position using an inferior approach. At the conclusion of the procedure, a coil shaped tissue marker clip was deployed into the biopsy cavity. Follow up 2 view mammogram was performed and dictated separately. IMPRESSION: Ultrasound guided biopsy of a mass in the right breast 3 o'clock position. No apparent complications. Electronically Signed: By: Jacob Moores M.D. On: 01/06/2023 14:26   MM CLIP PLACEMENT RIGHT  Result Date: 01/06/2023 CLINICAL DATA:  Post-procedure mammogram EXAM: 3D DIAGNOSTIC RIGHT MAMMOGRAM POST  ULTRASOUND BIOPSY COMPARISON:  Previous exam(s). FINDINGS: 3D Mammographic images were obtained following ultrasound guided biopsy of a mass in the right breast 3 o'clock retroareolar position. The coil shaped biopsy marking clip is in expected position at the site of biopsy. IMPRESSION: Appropriate positioning of the coil shaped biopsy marking clip at the site of biopsy in the right breast 3 o'clock retroareolar position. Final Assessment: Post Procedure Mammograms for Marker Placement Electronically Signed   By: Jacob Moores M.D.   On: 01/06/2023 14:45   MM DIAG BREAST TOMO UNI RIGHT  Result Date: 12/31/2022 CLINICAL DATA:  Possible right breast mass seen on most recent screening mammography. EXAM: DIGITAL DIAGNOSTIC UNILATERAL RIGHT MAMMOGRAM WITH TOMOSYNTHESIS AND CAD; ULTRASOUND RIGHT BREAST LIMITED TECHNIQUE: Right digital diagnostic mammography and breast tomosynthesis was performed. The images were evaluated with computer-aided detection. ; Targeted ultrasound examination of the right breast was performed COMPARISON:  Previous exam(s). ACR Breast Density Category b: There are scattered areas of fibroglandular density. FINDINGS: Additional mammographic views of the right breast demonstrate persistent 1 cm lobulated mass in the lower slightly inner breast, anterior depth. Targeted right breast ultrasound is performed demonstrating 3 o'clock retroareolar location lobulated hypoechoic solid-appearing mass measuring 1.2 x 0.8 x 0.9 cm. This finding corresponds to the mammographically seen mass. No evidence of right axillary lymphadenopathy. IMPRESSION: Right breast 3 o'clock suspicious mass, for which ultrasound-guided core needle biopsy is recommended. RECOMMENDATION: One site ultrasound-guided core needle biopsy of the right breast. I have discussed the findings and recommendations with the patient. If applicable, a reminder letter will be sent to the patient regarding the next appointment. BI-RADS  CATEGORY  4: Suspicious. Electronically Signed   By: Ted Mcalpine M.D.   On: 12/31/2022 15:05   US Breast Limited Uni Right Inc Axilla  Result Date: 12/31/2022 CLINICAL DATA:  Possible right breast mass seen on most recent screening mammography. EXAM:  DIGITAL DIAGNOSTIC UNILATERAL RIGHT MAMMOGRAM WITH TOMOSYNTHESIS AND CAD; ULTRASOUND RIGHT BREAST LIMITED TECHNIQUE: Right digital diagnostic mammography and breast tomosynthesis was performed. The images were evaluated with computer-aided detection. ; Targeted ultrasound examination of the right breast was performed COMPARISON:  Previous exam(s). ACR Breast Density Category b: There are scattered areas of fibroglandular density. FINDINGS: Additional mammographic views of the right breast demonstrate persistent 1 cm lobulated mass in the lower slightly inner breast, anterior depth. Targeted right breast ultrasound is performed demonstrating 3 o'clock retroareolar location lobulated hypoechoic solid-appearing mass measuring 1.2 x 0.8 x 0.9 cm. This finding corresponds to the mammographically seen mass. No evidence of right axillary lymphadenopathy. IMPRESSION: Right breast 3 o'clock suspicious mass, for which ultrasound-guided core needle biopsy is recommended. RECOMMENDATION: One site ultrasound-guided core needle biopsy of the right breast. I have discussed the findings and recommendations with the patient. If applicable, a reminder letter will be sent to the patient regarding the next appointment. BI-RADS CATEGORY  4: Suspicious. Electronically Signed   By: Ted Mcalpine M.D.   On: 12/31/2022 15:05   MM 3D SCREENING MAMMOGRAM BILATERAL BREAST  Result Date: 12/29/2022 CLINICAL DATA:  Screening. EXAM: DIGITAL SCREENING BILATERAL MAMMOGRAM WITH TOMOSYNTHESIS AND CAD TECHNIQUE: Bilateral screening digital craniocaudal and mediolateral oblique mammograms were obtained. Bilateral screening digital breast tomosynthesis was performed. The images were  evaluated with computer-aided detection. COMPARISON:  Previous exam(s). ACR Breast Density Category b: There are scattered areas of fibroglandular density. FINDINGS: In the right breast, a possible mass warrants further evaluation. In the left breast, no findings suspicious for malignancy. IMPRESSION: Further evaluation is suggested for a possible mass in the right breast. RECOMMENDATION: Diagnostic mammogram and possibly ultrasound of the right breast. (Code:[id number]) The patient will be contacted regarding the findings, and additional imaging will be scheduled. BI-RADS CATEGORY  0: Incomplete: Need additional imaging evaluation. Electronically Signed   By: Frederico Hamman M.D.   On: 12/29/2022 10:55   ASSESSMENT & PLAN:   Carcinoma of upper-outer quadrant of left breast in female, estrogen receptor positive (HCC) # Right breast cancer stage I- cT1b cN0-ER/PR positive HER2 negative.  History of left breast cancer 2011-status post chemoradiation lumpectomy-followed by endocrine therapy.   # I had a long discussion with the patient in general regarding the treatment options of breast cancer including-surgery; adjuvant radiation; role of adjuvant systemic therapy including-chemotherapy antihormone therapy.    # Given patient's age-chemotherapy; and radiation therapy could be omitted.  # Discussed regarding lumpectomy without sentinel lymph node biopsy.  However patient and son extremely concerned about the potential risk of surgery age and also risk of anesthesia given her dementia/episodes of confusion.  Will defer to Dr. Maia Plan.  Discussed with Dr. Maia Plan.  #I discussed the role of endocrine therapy-given ER/PR positive disease.  Patient is also considering use of antihormone therapy without surgery.  However will currently await evaluation Dr. Maia Plan.  # Back pain/sciatica [ sep 2023- [facility]]- s/p injection-currently improved.  # Mild to moderate dementia/with episodes of confusion  especially in the evenings.  Thank you for allowing me to participate in the care of your pleasant patient. Please do not hesitate to contact me with questions or concerns in the interim.   # DISPOSITION: # no labs- # referral to Dr.Cintron re: breast cancer # follow up in 4 weeks- MD: no labs-Dr.B  All questions were answered. The patient/family knows to call the clinic with any problems, questions or concerns.    Earna Coder, MD 01/13/2023 4:15  PM

## 2023-01-14 ENCOUNTER — Encounter: Payer: Self-pay | Admitting: Internal Medicine

## 2023-01-14 NOTE — Progress Notes (Signed)
Discussed with Dr. Maia Plan.  As per patient's request left a message for the son to discuss their concerns.  Recommend call us back to discuss further. Thank you,  Gb

## 2023-01-18 ENCOUNTER — Telehealth: Payer: Self-pay | Admitting: Internal Medicine

## 2023-01-18 ENCOUNTER — Telehealth: Payer: Self-pay

## 2023-01-18 ENCOUNTER — Other Ambulatory Visit: Payer: Self-pay | Admitting: *Deleted

## 2023-01-18 MED ORDER — ANASTROZOLE 1 MG PO TABS: 1.0000 mg | ORAL_TABLET | Freq: Every day | ORAL | 4 refills | Status: DC

## 2023-01-18 NOTE — Telephone Encounter (Signed)
  Dr B spoke with son. Anastrozole prescription sent to [facility]. Pt has an appt in August for follow up.

## 2023-01-18 NOTE — Telephone Encounter (Signed)
Patient son states that the Patient has decided over the weekend that she would like to move forward with the Antihormone therapy without Surgery. Callback [phone]

## 2023-01-18 NOTE — Telephone Encounter (Signed)
Declined surgery; LVM for son to discuss the AI therapy. GB

## 2023-01-19 ENCOUNTER — Telehealth: Payer: Self-pay

## 2023-01-19 NOTE — Telephone Encounter (Signed)
Prior auth submitted thru covermymeds: (Key: [id number]) Rx #: [id number] Anastrozole 1MG  tablets

## 2023-01-19 NOTE — Telephone Encounter (Signed)
Per covermymeds:  Prior Authorization is not needed for the patient/medication.

## 2023-02-10 ENCOUNTER — Encounter: Payer: Self-pay | Admitting: Internal Medicine

## 2023-02-10 ENCOUNTER — Inpatient Hospital Stay: Payer: Medicare HMO | Attending: Internal Medicine | Admitting: Internal Medicine

## 2023-02-10 VITALS — BP 142/64 | HR 81 | Temp 98.1°F | Ht 62.0 in | Wt 145.2 lb

## 2023-02-10 DIAGNOSIS — Z79811 Long term (current) use of aromatase inhibitors: Secondary | ICD-10-CM | POA: Diagnosis not present

## 2023-02-10 DIAGNOSIS — C50412 Malignant neoplasm of upper-outer quadrant of left female breast: Secondary | ICD-10-CM | POA: Diagnosis not present

## 2023-02-10 DIAGNOSIS — C50511 Malignant neoplasm of lower-outer quadrant of right female breast: Secondary | ICD-10-CM | POA: Diagnosis present

## 2023-02-10 DIAGNOSIS — Z923 Personal history of irradiation: Secondary | ICD-10-CM | POA: Diagnosis not present

## 2023-02-10 DIAGNOSIS — Z170 Estrogen receptor positive status [ER+]: Secondary | ICD-10-CM | POA: Diagnosis not present

## 2023-02-10 DIAGNOSIS — Z9221 Personal history of antineoplastic chemotherapy: Secondary | ICD-10-CM | POA: Diagnosis not present

## 2023-02-10 DIAGNOSIS — Z79899 Other long term (current) drug therapy: Secondary | ICD-10-CM | POA: Diagnosis not present

## 2023-02-10 NOTE — Progress Notes (Signed)
Per pt f/u of anastrozole. Noted no side effects.

## 2023-02-10 NOTE — Assessment & Plan Note (Addendum)
#   Right breast cancer stage I- cT1b cN0-ER/PR positive HER2 negative- declined surgery given the risk of anesthesia/surgery [Dr.Cintron]. currently on anastrazole.   # Bone health: discussed re: risk of osteoporosis. Recommend BMD- ca+vit D;   # Back pain/sciatica [ sep 2023- [facility]]- s/p injection-currently improved.  # Mild to moderate dementia/with episodes of confusion especially in the evenings.  # DISPOSITION: # follow up in 3 months- MD: labs- cbc/cmp; Vit D 25-OH; BMD prior--Dr.B

## 2023-02-10 NOTE — Progress Notes (Signed)
[facility] CONSULT NOTE  Patient Care Team: Marina Goodell, MD as PCP - General (Family Medicine) Earna Coder, MD as Consulting Physician (Oncology)  CHIEF COMPLAINTS/PURPOSE OF CONSULTATION: Breast cancer  #  Oncology History Overview Note  # 2011-  Carcinoma of breast status post lumpectomyestrogen receptor positive, progesterone receptor positive.  Positive margin with ductal carcinoma in situ. [Dr.Smith] 2. Oncotype DX score is 28 with recurring score is high in between 14%-22% on average 18 %. 3. Cytoxan and Taxotere for 4 cycle 4. Followed by radiation therapy finished in December of 2011  5. Started on Femara.  December of 2011 6. Changed to Arimidex (insurance request) 7/finishing Arimidex  in May of 2016 ---------------------   INVASIVE MAMMARY CARCINOMA, SEE NOTE - TUBULE FORMATION: SCORE 3 - NUCLEAR PLEOMORPHISM: SCORE 2 - MITOTIC COUNT: SCORE 1 - TOTAL SCORE: 6 - OVERALL GRADE: 2 - LYMPHOVASCULAR INVASION: NOT IDENTIFIED - CANCER LENGTH: 1.1 CM - CALCIFICATIONS: NOT IDENTIFIED  Targeted right breast ultrasound is performed demonstrating 3 o'clock retroareolar location lobulated hypoechoic solid-appearing mass measuring 1.2 x 0.8 x 0.9 cm. This finding corresponds to the mammographically seen mass  # JUNE 2024-Right breast cancer stage I- cT1b cN0-ER/PR positive HER2 negative- declined surgery given the risk of anesthesia/surgery [Dr.Cintron]. AUG 2024-  currently on anastrazole.       Cancer of breast (HCC)  Carcinoma of upper-outer quadrant of left breast in female, estrogen receptor positive (HCC)  01/20/2016 Initial Diagnosis   Malignant neoplasm of upper-outer quadrant of left female breast (HCC)   01/13/2023 Cancer Staging   Staging form: Breast, AJCC 7th Edition - Clinical: Stage IA (T1b, N0, M0) - Signed by Earna Coder, MD on 01/13/2023 Histologic grade (G): G2 Estrogen receptor status: Positive Progesterone  receptor status: Positive HER2 status: Negative      HISTORY OF PRESENTING ILLNESS: Patient ambulating- with assistance- cane. Accompanied by family- son.   Victoria Prince 89 y.o.  female mild to moderate dementia diagnosis of right breast cancer-stage I ER/PR positive HER2 negative [July 2024; prior Left sided stage I-2011]-currently on anastrozole is here for follow-up.  Patient evaluated by surgery.  After further discussion with the surgeon patient/family decided not to proceed with surgery given the concerns of complications.   Otherwise patient denies any new lumps or bumps. Patient's son lives with the patient.  Review of Systems  Constitutional:  Negative for chills, diaphoresis, fever, malaise/fatigue and weight loss.  HENT:  Negative for nosebleeds and sore throat.   Eyes:  Negative for double vision.  Respiratory:  Negative for cough, hemoptysis, sputum production, shortness of breath and wheezing.   Cardiovascular:  Negative for chest pain, palpitations, orthopnea and leg swelling.  Gastrointestinal:  Negative for abdominal pain, blood in stool, constipation, diarrhea, heartburn, melena, nausea and vomiting.  Genitourinary:  Negative for dysuria, frequency and urgency.  Musculoskeletal:  Positive for back pain and joint pain.  Skin: Negative.  Negative for itching and rash.  Neurological:  Negative for dizziness, tingling, focal weakness, weakness and headaches.  Endo/Heme/Allergies:  Does not bruise/bleed easily.  Psychiatric/Behavioral:  Negative for depression. The patient is not nervous/anxious and does not have insomnia.      MEDICAL HISTORY:  Past Medical History:  Diagnosis Date   Breast cancer (HCC) 2011   left breast lumpectomy with rad and chemo tx   Personal history of chemotherapy 2011   left breast   Personal history of radiation therapy 2011   left breast  Radiation 2011   S/P chemotherapy, time since greater than 12 weeks 2011    SURGICAL  HISTORY: Past Surgical History:  Procedure Laterality Date   BREAST BIOPSY Left 2012   negative   BREAST BIOPSY Left 2011   2 areas removed. Retroaerola area benign.  UOQ was DCIS with positive margin per Dr. Donneta Romberg notes.    BREAST BIOPSY Right 12/26/2019   [place] bx, Q marker, PREDOMINANTLY SCLEROTIC FIBROUS STROMA WITH RARE ATROPHIC MAMMARY   BREAST BIOPSY Right 01/06/2023   [place] Core Bx. coil clip- path pending   BREAST BIOPSY Right 01/06/2023   [place] RT BREAST BX W LOC DEV 1ST LESION IMG BX SPEC US GUIDE 01/06/2023 [facility]   BREAST LUMPECTOMY Left    SKIN LESION EXCISION  06/2021    SOCIAL HISTORY: Social History   Socioeconomic History   Marital status: Married    Spouse name: Not on file   Number of children: Not on file   Years of education: Not on file   Highest education level: Not on file  Occupational History   Not on file  Tobacco Use   Smoking status: Never   Smokeless tobacco: Never  Vaping Use   Vaping status: Never Used  Substance and Sexual Activity   Alcohol use: Never   Drug use: Never   Sexual activity: Not on file  Other Topics Concern   Not on file  Social History Narrative   Not on file   Social Determinants of Health   Financial Resource Strain: Not on file  Food Insecurity: No Food Insecurity (03/09/2022)   Hunger Vital Sign    Worried About Running Out of Food in the Last Year: Never true    Ran Out of Food in the Last Year: Never true  Transportation Needs: No Transportation Needs (03/09/2022)   PRAPARE - Administrator, Civil Service (Medical): No    Lack of Transportation (Non-Medical): No  Physical Activity: Not on file  Stress: Not on file  Social Connections: Not on file  Intimate Partner Violence: Not At Risk (03/09/2022)   Humiliation, Afraid, Rape, and Kick questionnaire    Fear of Current or Ex-Partner: No    Emotionally Abused: No    Physically Abused: No    Sexually Abused: No    FAMILY HISTORY: Family  History  Problem Relation Age of Onset   Breast cancer Neg Hx     ALLERGIES:  is allergic to sulfa antibiotics.  MEDICATIONS:  Current Outpatient Medications  Medication Sig Dispense Refill   acetaminophen (TYLENOL) 325 MG tablet Take 650 mg by mouth every 6 (six) hours as needed.     amitriptyline (ELAVIL) 25 MG tablet Take 25 mg by mouth at bedtime.      amLODipine (NORVASC) 2.5 MG tablet Take 2.5 mg by mouth daily.      anastrozole (ARIMIDEX) 1 MG tablet Take 1 tablet (1 mg total) by mouth daily. 30 tablet 4   cetirizine (ZYRTEC) 10 MG tablet Take 1 tablet by mouth daily.     LORazepam (ATIVAN) 0.5 MG tablet Take 0.5 mg by mouth every 8 (eight) hours.     oxyCODONE (OXY IR/ROXICODONE) 5 MG immediate release tablet Take 1 tablet (5 mg total) by mouth every 6 (six) hours as needed for moderate pain. 10 tablet 0   pregabalin (LYRICA) 25 MG capsule Take 1 capsule (25 mg total) by mouth at bedtime. 30 capsule 1   senna-docusate (SENOKOT-S) 8.6-50 MG tablet Take 1 tablet  by mouth at bedtime as needed for mild constipation. 30 tablet 0   triamcinolone cream (KENALOG) 0.5 % Apply topically as directed.     naproxen sodium (ALEVE) 220 MG tablet Take 220 mg by mouth daily as needed (pain). (Patient not taking: Reported on 01/13/2023)     nystatin powder Apply 1 Application topically 2 (two) times daily. (Patient not taking: Reported on 12/29/2022)     pravastatin (PRAVACHOL) 40 MG tablet Take 40 mg by mouth daily.  (Patient not taking: Reported on 12/29/2022)     No current facility-administered medications for this visit.     PHYSICAL EXAMINATION:  Vitals:   02/10/23 1033  BP: (!) 142/64  Pulse: 81  Temp: 98.1 F (36.7 C)  SpO2: 99%    Filed Weights   02/10/23 1033  Weight: 145 lb 3.2 oz (65.9 kg)     Physical Exam Vitals and nursing note reviewed.  HENT:     Head: Normocephalic and atraumatic.     Mouth/Throat:     Pharynx: Oropharynx is clear.  Eyes:     Extraocular  Movements: Extraocular movements intact.     Pupils: Pupils are equal, round, and reactive to light.  Cardiovascular:     Rate and Rhythm: Normal rate and regular rhythm.  Pulmonary:     Comments: Decreased breath sounds bilaterally.  Abdominal:     Palpations: Abdomen is soft.  Musculoskeletal:        General: Normal range of motion.     Cervical back: Normal range of motion.  Skin:    General: Skin is warm.  Neurological:     General: No focal deficit present.     Mental Status: She is alert and oriented to person, place, and time.  Psychiatric:        Behavior: Behavior normal.        Judgment: Judgment normal.      LABORATORY DATA:  I have reviewed the data as listed Lab Results  Component Value Date   WBC 4.7 12/29/2022   HGB 14.2 12/29/2022   HCT 43.3 12/29/2022   MCV 99.5 12/29/2022   PLT 173 12/29/2022   Recent Labs    03/09/22 1017 03/11/22 0416 12/29/22 0949  NA 139 139 140  K 4.3 4.7 4.6  CL 108 107 106  CO2 22 26 27   GLUCOSE 164* 124* 93  BUN 14 29* 18  CREATININE 1.06* 1.06* 1.13*  CALCIUM 9.6 9.4 9.4  GFRNONAA 50* 50* 46*  PROT  --   --  7.0  ALBUMIN  --   --  4.0  AST  --   --  19  ALT  --   --  13  ALKPHOS  --   --  62  BILITOT  --   --  0.4    RADIOGRAPHIC STUDIES: I have personally reviewed the radiological images as listed and agreed with the findings in the report. No results found.  ASSESSMENT & PLAN:   Carcinoma of upper-outer quadrant of left breast in female, estrogen receptor positive (HCC) # Right breast cancer stage I- cT1b cN0-ER/PR positive HER2 negative- declined surgery given the risk of anesthesia/surgery [Dr.Cintron]. currently on anastrazole.   # Bone health: discussed re: risk of osteoporosis. Recommend BMD- ca+vit D;   # Back pain/sciatica [ sep 2023- ibriefly hospice]- s/p injection-currently improved.  # Mild to moderate dementia/with episodes of confusion especially in the evenings.  # DISPOSITION: # follow  up in 3 months- MD: labs- cbc/cmp; Vit  D 25-OH; BMD prior--Dr.B  All questions were answered. The patient/family knows to call the clinic with any problems, questions or concerns.    Earna Coder, MD 02/10/2023 11:57 AM

## 2023-05-04 ENCOUNTER — Ambulatory Visit
Admission: RE | Admit: 2023-05-04 | Discharge: 2023-05-04 | Disposition: A | Discharge status: Home / Self Care | Payer: Medicare HMO | Source: Ambulatory Visit | Attending: Internal Medicine | Admitting: Internal Medicine

## 2023-05-04 DIAGNOSIS — C50412 Malignant neoplasm of upper-outer quadrant of left female breast: Secondary | ICD-10-CM | POA: Diagnosis present

## 2023-05-04 DIAGNOSIS — Z170 Estrogen receptor positive status [ER+]: Secondary | ICD-10-CM | POA: Diagnosis present

## 2023-05-04 DIAGNOSIS — M810 Age-related osteoporosis without current pathological fracture: Secondary | ICD-10-CM | POA: Diagnosis not present

## 2023-05-04 DIAGNOSIS — Z780 Asymptomatic menopausal state: Secondary | ICD-10-CM | POA: Diagnosis not present

## 2023-05-12 ENCOUNTER — Inpatient Hospital Stay: Payer: Medicare HMO | Attending: Internal Medicine

## 2023-05-12 ENCOUNTER — Encounter: Payer: Self-pay | Admitting: Internal Medicine

## 2023-05-12 ENCOUNTER — Inpatient Hospital Stay (HOSPITAL_BASED_OUTPATIENT_CLINIC_OR_DEPARTMENT_OTHER): Payer: Medicare HMO | Admitting: Internal Medicine

## 2023-05-12 VITALS — BP 128/71 | HR 79 | Temp 98.0°F | Ht 62.0 in | Wt 146.9 lb

## 2023-05-12 DIAGNOSIS — Z170 Estrogen receptor positive status [ER+]: Secondary | ICD-10-CM | POA: Diagnosis present

## 2023-05-12 DIAGNOSIS — F03B0 Unspecified dementia, moderate, without behavioral disturbance, psychotic disturbance, mood disturbance, and anxiety: Secondary | ICD-10-CM | POA: Insufficient documentation

## 2023-05-12 DIAGNOSIS — Z79811 Long term (current) use of aromatase inhibitors: Secondary | ICD-10-CM | POA: Insufficient documentation

## 2023-05-12 DIAGNOSIS — Z9221 Personal history of antineoplastic chemotherapy: Secondary | ICD-10-CM | POA: Diagnosis not present

## 2023-05-12 DIAGNOSIS — C50412 Malignant neoplasm of upper-outer quadrant of left female breast: Secondary | ICD-10-CM | POA: Diagnosis not present

## 2023-05-12 DIAGNOSIS — C50511 Malignant neoplasm of lower-outer quadrant of right female breast: Secondary | ICD-10-CM | POA: Diagnosis present

## 2023-05-12 DIAGNOSIS — Z923 Personal history of irradiation: Secondary | ICD-10-CM | POA: Insufficient documentation

## 2023-05-12 DIAGNOSIS — Z79899 Other long term (current) drug therapy: Secondary | ICD-10-CM | POA: Insufficient documentation

## 2023-05-12 DIAGNOSIS — C50811 Malignant neoplasm of overlapping sites of right female breast: Secondary | ICD-10-CM

## 2023-05-12 DIAGNOSIS — M810 Age-related osteoporosis without current pathological fracture: Secondary | ICD-10-CM | POA: Insufficient documentation

## 2023-05-12 LAB — CMP (CANCER CENTER ONLY)
ALT: 17 U/L (ref 0–44)
AST: 22 U/L (ref 15–41)
Albumin: 4 g/dL (ref 3.5–5.0)
Alkaline Phosphatase: 64 U/L (ref 38–126)
Anion gap: 11 (ref 5–15)
BUN: 16 mg/dL (ref 8–23)
CO2: 25 mmol/L (ref 22–32)
Calcium: 9.4 mg/dL (ref 8.9–10.3)
Chloride: 105 mmol/L (ref 98–111)
Creatinine: 1.34 mg/dL — ABNORMAL HIGH (ref 0.44–1.00)
GFR, Estimated: 37 mL/min — ABNORMAL LOW (ref >60)
Glucose, Bld: 99 mg/dL (ref 70–99)
Potassium: 4 mmol/L (ref 3.5–5.1)
Sodium: 141 mmol/L (ref 135–145)
Total Bilirubin: 0.4 mg/dL (ref <1.2)
Total Protein: 6.9 g/dL (ref 6.5–8.1)

## 2023-05-12 LAB — CBC WITH DIFFERENTIAL (CANCER CENTER ONLY)
Abs Immature Granulocytes: 0.02 10*3/uL (ref 0.00–0.07)
Basophils Absolute: 0 10*3/uL (ref 0.0–0.1)
Basophils Relative: 1 %
Eosinophils Absolute: 0 10*3/uL (ref 0.0–0.5)
Eosinophils Relative: 1 %
HCT: 42.8 % (ref 36.0–46.0)
Hemoglobin: 14 g/dL (ref 12.0–15.0)
Immature Granulocytes: 0 %
Lymphocytes Relative: 34 %
Lymphs Abs: 2.2 10*3/uL (ref 0.7–4.0)
MCH: 32.9 pg (ref 26.0–34.0)
MCHC: 32.7 g/dL (ref 30.0–36.0)
MCV: 100.7 fL — ABNORMAL HIGH (ref 80.0–100.0)
Monocytes Absolute: 0.4 10*3/uL (ref 0.1–1.0)
Monocytes Relative: 7 %
Neutro Abs: 3.7 10*3/uL (ref 1.7–7.7)
Neutrophils Relative %: 57 %
Platelet Count: 173 10*3/uL (ref 150–400)
RBC: 4.25 MIL/uL (ref 3.87–5.11)
RDW: 14.4 % (ref 11.5–15.5)
WBC Count: 6.4 10*3/uL (ref 4.0–10.5)
nRBC: 0 % (ref 0.0–0.2)

## 2023-05-12 LAB — VITAMIN D 25 HYDROXY (VIT D DEFICIENCY, FRACTURES): Vit D, 25-Hydroxy: 7.75 ng/mL — ABNORMAL LOW (ref 30–100)

## 2023-05-12 MED ORDER — TRIAMCINOLONE ACETONIDE 0.5 % EX CREA: TOPICAL_CREAM | CUTANEOUS | 1 refills | Status: AC

## 2023-05-12 MED ORDER — ANASTROZOLE 1 MG PO TABS: 1.0000 mg | ORAL_TABLET | Freq: Every day | ORAL | 1 refills | Status: DC

## 2023-05-12 MED ORDER — ALENDRONATE SODIUM 70 MG PO TABS: ORAL_TABLET | ORAL | 1 refills | Status: DC

## 2023-05-12 NOTE — Progress Notes (Signed)
[facility] CONSULT NOTE  Patient Care Team: Marina Goodell, MD as PCP - General (Family Medicine) Earna Coder, MD as Consulting Physician (Oncology)  CHIEF COMPLAINTS/PURPOSE OF CONSULTATION: Breast cancer  #  Oncology History Overview Note  # 2011-  Carcinoma of breast status post lumpectomyestrogen receptor positive, progesterone receptor positive.  Positive margin with ductal carcinoma in situ. [Dr.Smith] 2. Oncotype DX score is 28 with recurring score is high in between 14%-22% on average 18 %. 3. Cytoxan and Taxotere for 4 cycle 4. Followed by radiation therapy finished in December of 2011  5. Started on Femara.  December of 2011 6. Changed to Arimidex (insurance request) 7/finishing Arimidex  in May of 2016 ---------------------   INVASIVE MAMMARY CARCINOMA, SEE NOTE - TUBULE FORMATION: SCORE 3 - NUCLEAR PLEOMORPHISM: SCORE 2 - MITOTIC COUNT: SCORE 1 - TOTAL SCORE: 6 - OVERALL GRADE: 2 - LYMPHOVASCULAR INVASION: NOT IDENTIFIED - CANCER LENGTH: 1.1 CM - CALCIFICATIONS: NOT IDENTIFIED  Targeted right breast ultrasound is performed demonstrating 3 o'clock retroareolar location lobulated hypoechoic solid-appearing mass measuring 1.2 x 0.8 x 0.9 cm. This finding corresponds to the mammographically seen mass  # JUNE 2024-Right breast cancer stage I- cT1b cN0-ER/PR positive HER2 negative- declined surgery given the risk of anesthesia/surgery [Dr.Cintron]. AUG 2024-  currently on anastrazole.       Malignant neoplasm of overlapping sites of right breast in female, estrogen receptor positive (HCC)  Carcinoma of upper-outer quadrant of left breast in female, estrogen receptor positive (HCC)  01/20/2016 Initial Diagnosis   Malignant neoplasm of upper-outer quadrant of left female breast (HCC)   01/13/2023 Cancer Staging   Staging form: Breast, AJCC 7th Edition - Clinical: Stage IA (T1b, N0, M0) - Signed by Earna Coder, MD on  01/13/2023 Histologic grade (G): G2 Estrogen receptor status: Positive Progesterone receptor status: Positive HER2 status: Negative      HISTORY OF PRESENTING ILLNESS: Patient ambulating- with assistance- cane. Accompanied by family- son.   Victoria Prince 89 y.o.  female mild to moderate dementia diagnosis of INTACT right breast cancer-stage I ER/PR positive HER2 negative [July 2024;declined/not candidate for surgery]-currently on anastrozole is here for follow-up/  And review results of the bone density.  No worsening joint pains or hot flashes.  No fall.  Continues to be compliant with her anastrozole.  Otherwise patient denies any new lumps or bumps. Patient's son lives with the patient.  Review of Systems  Constitutional:  Negative for chills, diaphoresis, fever, malaise/fatigue and weight loss.  HENT:  Negative for nosebleeds and sore throat.   Eyes:  Negative for double vision.  Respiratory:  Negative for cough, hemoptysis, sputum production, shortness of breath and wheezing.   Cardiovascular:  Negative for chest pain, palpitations, orthopnea and leg swelling.  Gastrointestinal:  Negative for abdominal pain, blood in stool, constipation, diarrhea, heartburn, melena, nausea and vomiting.  Genitourinary:  Negative for dysuria, frequency and urgency.  Musculoskeletal:  Positive for back pain and joint pain.  Skin: Negative.  Negative for itching and rash.  Neurological:  Negative for dizziness, tingling, focal weakness, weakness and headaches.  Endo/Heme/Allergies:  Does not bruise/bleed easily.  Psychiatric/Behavioral:  Negative for depression. The patient is not nervous/anxious and does not have insomnia.      MEDICAL HISTORY:  Past Medical History:  Diagnosis Date   Breast cancer (HCC) 2011   left breast lumpectomy with rad and chemo tx   Personal history of chemotherapy 2011   left breast   Personal  history of radiation therapy 2011   left breast   Radiation 2011    S/P chemotherapy, time since greater than 12 weeks 2011    SURGICAL HISTORY: Past Surgical History:  Procedure Laterality Date   BREAST BIOPSY Left 2012   negative   BREAST BIOPSY Left 2011   2 areas removed. Retroaerola area benign.  UOQ was DCIS with positive margin per Dr. Donneta Romberg notes.    BREAST BIOPSY Right 12/26/2019   [place] bx, Q marker, PREDOMINANTLY SCLEROTIC FIBROUS STROMA WITH RARE ATROPHIC MAMMARY   BREAST BIOPSY Right 01/06/2023   [place] Core Bx. coil clip- path pending   BREAST BIOPSY Right 01/06/2023   [place] RT BREAST BX W LOC DEV 1ST LESION IMG BX SPEC US GUIDE 01/06/2023 [facility]   BREAST LUMPECTOMY Left    SKIN LESION EXCISION  06/2021    SOCIAL HISTORY: Social History   Socioeconomic History   Marital status: Married    Spouse name: Not on file   Number of children: Not on file   Years of education: Not on file   Highest education level: Not on file  Occupational History   Not on file  Tobacco Use   Smoking status: Never   Smokeless tobacco: Never  Vaping Use   Vaping status: Never Used  Substance and Sexual Activity   Alcohol use: Never   Drug use: Never   Sexual activity: Not on file  Other Topics Concern   Not on file  Social History Narrative   Not on file   Social Determinants of Health   Financial Resource Strain: Not on file  Food Insecurity: No Food Insecurity (03/09/2022)   Hunger Vital Sign    Worried About Running Out of Food in the Last Year: Never true    Ran Out of Food in the Last Year: Never true  Transportation Needs: No Transportation Needs (03/09/2022)   PRAPARE - Administrator, Civil Service (Medical): No    Lack of Transportation (Non-Medical): No  Physical Activity: Not on file  Stress: Not on file  Social Connections: Not on file  Intimate Partner Violence: Not At Risk (03/09/2022)   Humiliation, Afraid, Rape, and Kick questionnaire    Fear of Current or Ex-Partner: No    Emotionally Abused: No     Physically Abused: No    Sexually Abused: No    FAMILY HISTORY: Family History  Problem Relation Age of Onset   Breast cancer Neg Hx     ALLERGIES:  is allergic to sulfa antibiotics.  MEDICATIONS:  Current Outpatient Medications  Medication Sig Dispense Refill   acetaminophen (TYLENOL) 325 MG tablet Take 650 mg by mouth every 6 (six) hours as needed.     alendronate (FOSAMAX) 70 MG tablet 1 pill every other week. Take with a full glass of water on an empty stomach. 15 tablet 1   amitriptyline (ELAVIL) 25 MG tablet Take 25 mg by mouth at bedtime.      amLODipine (NORVASC) 2.5 MG tablet Take 2.5 mg by mouth daily.      cetirizine (ZYRTEC) 10 MG tablet Take 1 tablet by mouth daily.     LORazepam (ATIVAN) 0.5 MG tablet Take 0.5 mg by mouth every 8 (eight) hours.     oxyCODONE (OXY IR/ROXICODONE) 5 MG immediate release tablet Take 1 tablet (5 mg total) by mouth every 6 (six) hours as needed for moderate pain. 10 tablet 0   pregabalin (LYRICA) 25 MG capsule Take 1 capsule (25 mg  total) by mouth at bedtime. 30 capsule 1   senna-docusate (SENOKOT-S) 8.6-50 MG tablet Take 1 tablet by mouth at bedtime as needed for mild constipation. 30 tablet 0   anastrozole (ARIMIDEX) 1 MG tablet Take 1 tablet (1 mg total) by mouth daily. 90 tablet 1   naproxen sodium (ALEVE) 220 MG tablet Take 220 mg by mouth daily as needed (pain). (Patient not taking: Reported on 01/13/2023)     nystatin powder Apply 1 Application topically 2 (two) times daily. (Patient not taking: Reported on 12/29/2022)     triamcinolone cream (KENALOG) 0.5 % Apply topically as directed. 30 g 1   No current facility-administered medications for this visit.     PHYSICAL EXAMINATION:  Vitals:   05/12/23 1409  BP: 128/71  Pulse: 79  Temp: 98 F (36.7 C)  SpO2: 97%    Filed Weights   05/12/23 1409  Weight: 146 lb 14.4 oz (66.6 kg)     Physical Exam Vitals and nursing note reviewed.  HENT:     Head: Normocephalic and  atraumatic.     Mouth/Throat:     Pharynx: Oropharynx is clear.  Eyes:     Extraocular Movements: Extraocular movements intact.     Pupils: Pupils are equal, round, and reactive to light.  Cardiovascular:     Rate and Rhythm: Normal rate and regular rhythm.  Pulmonary:     Comments: Decreased breath sounds bilaterally.  Abdominal:     Palpations: Abdomen is soft.  Musculoskeletal:        General: Normal range of motion.     Cervical back: Normal range of motion.  Skin:    General: Skin is warm.  Neurological:     General: No focal deficit present.     Mental Status: She is alert and oriented to person, place, and time.  Psychiatric:        Behavior: Behavior normal.        Judgment: Judgment normal.      LABORATORY DATA:  I have reviewed the data as listed Lab Results  Component Value Date   WBC 6.4 05/12/2023   HGB 14.0 05/12/2023   HCT 42.8 05/12/2023   MCV 100.7 (H) 05/12/2023   PLT 173 05/12/2023   Recent Labs    12/29/22 0949 05/12/23 1359  NA 140 141  K 4.6 4.0  CL 106 105  CO2 27 25  GLUCOSE 93 99  BUN 18 16  CREATININE 1.13* 1.34*  CALCIUM 9.4 9.4  GFRNONAA 46* 37*  PROT 7.0 6.9  ALBUMIN 4.0 4.0  AST 19 22  ALT 13 17  ALKPHOS 62 64  BILITOT 0.4 0.4    RADIOGRAPHIC STUDIES: I have personally reviewed the radiological images as listed and agreed with the findings in the report. DG Bone Density  Result Date: 05/04/2023 EXAM: DUAL X-RAY ABSORPTIOMETRY (DXA) FOR BONE MINERAL DENSITY IMPRESSION: Your patient Victoria Prince completed a BMD test on 05/04/2023 using the [employer] DXA System (software version: 14.10) manufactured by [employer]. The following summarizes the results of our evaluation. Technologist: SCE PATIENT BIOGRAPHICAL: Name: Tiona, Clive Patient ID: [id number] Birth Date: 1929-05-25 Height: 62.0 in. Gender: Female Exam Date: 05/04/2023 Weight: 145.2 lbs. Indications: History of Breast Cancer, Previous Chemo and  Radiation, Advanced Age, left hip replacement, History of Osteoporosis, Postmenopausal, Caucasian Fractures: Treatments: Anastrozole DENSITOMETRY RESULTS: Site        Region Measured Date Measured Age WHO Classification Young Adult T-score BMD         %  Change vs. Previous Significant Change (*) AP Spine L1-L4 05/04/2023 92.5 Osteopenia -1.6 1.001 g/cm2 -4.2% Yes AP Spine L1-L4 01/18/2019 88.2 Osteopenia -1.2 1.045 g/cm2 1.7% - AP Spine L1-L4 10/26/2016 86.0 Osteopenia -1.3 1.028 g/cm2 -1.3% - AP Spine L1-L4 10/23/2014 84.0 Osteopenia -1.2 1.042 g/cm2 -1.8% - AP Spine L1-L4 10/17/2013 82.9 Osteopenia -1.1 1.061 g/cm2 5.6% Yes AP Spine L1-L4 03/27/2008 77.4 Osteopenia -1.5 1.005 g/cm2 - - Right Femur Neck 05/04/2023 92.5 Osteoporosis -2.8 0.652 g/cm2 -5.5% - Right Femur Neck 12/25/2021 91.1 Osteoporosis -2.5 0.690 g/cm2 -2.5% - Right Femur Neck 01/18/2019 88.2 Osteopenia -2.4 0.708 g/cm2 -2.5% - Right Femur Neck 10/26/2016 86.0 Osteopenia -2.2 0.726 g/cm2 -6.0% - Right Femur Neck 10/23/2014 84.0 Osteopenia -1.9 0.772 g/cm2 5.2% - Right Femur Neck 10/17/2013 82.9 Osteopenia -2.2 0.734 g/cm2 0.5% - Right Femur Neck 03/27/2008 77.4 Osteopenia -2.2 0.730 g/cm2 - - Right Femur Total 05/04/2023 92.5 Osteoporosis -2.5 0.693 g/cm2 -5.6% Yes Right Femur Total 12/25/2021 91.1 Osteopenia -2.2 0.734 g/cm2 -3.3% Yes Right Femur Total 01/18/2019 88.2 Osteopenia -2.0 0.759 g/cm2 -4.9% Yes Right Femur Total 10/26/2016 86.0 Osteopenia -1.7 0.798 g/cm2 -2.6% Yes Right Femur Total 10/23/2014 84.0 Osteopenia -1.5 0.819 g/cm2 1.6% - Right Femur Total 10/17/2013 82.9 Osteopenia -1.6 0.806 g/cm2 -0.6% - Right Femur Total 03/27/2008 77.4 Osteopenia -1.6 0.811 g/cm2 - - ASSESSMENT: The BMD measured at Femur Neck is 0.652 g/cm2 with a T-score of -2.8. This patient's diagnostic category is OSTEOPOROSIS according to World Health Organization [facility]) criteria. Left femur was excluded due to surgical hardware. The scan quality is good. Compared with  prior study, there has been a significant decrease in the spine. Compared with prior study, there has been a significant increase in the total hip. World Science writer [facility]) criteria for post-menopausal, Caucasian Women: Normal:                   T-score at or above -1 SD Osteopenia/low bone mass: T-score between -1 and -2.5 SD Osteoporosis:             T-score at or below -2.5 SD RECOMMENDATIONS: 1. All patients should optimize calcium and vitamin D intake. 2. Consider FDA-approved medical therapies in postmenopausal women and men aged 38 years and older, based on the following: a. A hip or vertebral(clinical or morphometric) fracture b. T-score < -2.5 at the femoral neck or spine after appropriate evaluation to exclude secondary causes c. Low bone mass (T-score between -1.0 and -2.5 at the femoral neck or spine) and a 10-year probability of a hip fracture > 3% or a 10-year probability of a major osteoporosis-related fracture > 20% based on the [place]-adapted WHO algorithm 3. Clinician judgment and/or patient preferences may indicate treatment for people with 10-year fracture probabilities above or below these levels FOLLOW-UP: People with diagnosed cases of osteoporosis or at high risk for fracture should have regular bone mineral density tests. For patients eligible for Medicare, routine testing is allowed once every 2 years. The testing frequency can be increased to one year for patients who have rapidly progressing disease, those who are receiving or discontinuing medical therapy to restore bone mass, or have additional risk factors. I have reviewed this report, and agree with the above findings. [facility] Electronically Signed   By: Harmon Pier M.D.   On: 05/04/2023 11:39    ASSESSMENT & PLAN:   Malignant neoplasm of overlapping sites of right breast in female, estrogen receptor positive [facility]) # JULY 2024- INTACT BREAST PRIMARY- Right breast cancer stage  I- cT1b cN0-ER/PR positive HER2  negative- declined surgery given the risk of anesthesia/surgery [Dr.Cintron]. currently on anastrazole.  Will repeat right breast mammogram/ultrasound to assess response to therapy.  # NOV 2024- BMD: OSTEOPOROSIS- T-score of -2.8. continue  ca+vit D; Discussed the potential risk factors for osteoporosis- age/gender/postmenopausal status/use of anti-estrogen treatments. Discussed multiple options including exercise/ calcium and vitamin D supplementation/ and also use of bisphosphonates. Discussed oral bisphosphonates versus parenteral bisphosphonate like Reclast Discussed the potential benefits and/side effects  Including but not limited to Osteonecrosis of jaw/ hypocalcemia.  Declined Reclast.  Patient/family interested in Fosamax.  Recommend Fosamax every 2 weeks given GFR of 37.  Prescription sent.  # Back pain/sciatica [ sep 2023- [facility]]- s/p injection-currently improved- stable.   # Mild to moderate dementia/with episodes of confusion especially in the evenings- stable.  # DISPOSITION: # follow up in 3 months- MD: labs- cbc/cmp;PRIOR Diagnostic mammogram/US - Dr.B   All questions were answered. The patient/family knows to call the clinic with any problems, questions or concerns.    Earna Coder, MD 05/12/2023 3:06 PM

## 2023-05-12 NOTE — Addendum Note (Signed)
Addended by: Darrold Span A on: 05/12/2023 03:24 PM   Modules accepted: Orders

## 2023-05-12 NOTE — Assessment & Plan Note (Signed)
#   JULY 2024- INTACT BREAST PRIMARY- Right breast cancer stage I- cT1b cN0-ER/PR positive HER2 negative- declined surgery given the risk of anesthesia/surgery [Dr.Cintron]. currently on anastrazole.  Will repeat right breast mammogram/ultrasound to assess response to therapy.  # NOV 2024- BMD: OSTEOPOROSIS- T-score of -2.8. continue  ca+vit D; Discussed the potential risk factors for osteoporosis- age/gender/postmenopausal status/use of anti-estrogen treatments. Discussed multiple options including exercise/ calcium and vitamin D supplementation/ and also use of bisphosphonates. Discussed oral bisphosphonates versus parenteral bisphosphonate like Reclast Discussed the potential benefits and/side effects  Including but not limited to Osteonecrosis of jaw/ hypocalcemia.  Declined Reclast.  Patient/family interested in Fosamax.  Recommend Fosamax every 2 weeks given GFR of 37.  Prescription sent.  # Back pain/sciatica [ sep 2023- [facility]]- s/p injection-currently improved- stable.   # Mild to moderate dementia/with episodes of confusion especially in the evenings- stable.  # DISPOSITION: # follow up in 3 months- MD: labs- cbc/cmp;PRIOR Diagnostic mammogram/US - Dr.B

## 2023-05-12 NOTE — Progress Notes (Signed)
Needs refill of anastrozole and triamcinolone cream , pended.   Bone density 05/04/23.

## 2023-05-12 NOTE — Assessment & Plan Note (Deleted)
#   Right breast cancer stage I- cT1b cN0-ER/PR positive HER2 negative- declined surgery given the risk of anesthesia/surgery [Dr.Cintron]. currently on anastrazole.   # Bone health: discussed re: risk of osteoporosis. Recommend BMD- ca+vit D; The BMD measured at Femur Neck is 0.652 g/cm2 with a T-score of -2.8.Fosamax every otherw eek [GFR 37]  # Back pain/sciatica [ sep 2023- [facility]]- s/p injection-currently improved.  # Mild to moderate dementia/with episodes of confusion especially in the evenings.    # follow up in 3 months- MD: labs- cbc/cmp; - Dr.B

## 2023-05-13 ENCOUNTER — Other Ambulatory Visit: Payer: Self-pay | Admitting: *Deleted

## 2023-05-13 ENCOUNTER — Telehealth: Payer: Self-pay | Admitting: *Deleted

## 2023-05-13 MED ORDER — ERGOCALCIFEROL 1.25 MG (50000 UT) PO CAPS: 50000.0000 [IU] | ORAL_CAPSULE | ORAL | 1 refills | Status: AC

## 2023-05-13 NOTE — Telephone Encounter (Signed)
Phone call to home. Informed family of new prescription for Vitamin D tabs take 1 tab weekly as patients Vitamin D levels came back very low. They will pick up Rx at [facility].

## 2023-07-27 ENCOUNTER — Ambulatory Visit
Admission: RE | Admit: 2023-07-27 | Discharge: 2023-07-27 | Disposition: A | Discharge status: Home / Self Care | Payer: Medicare HMO | Source: Ambulatory Visit | Attending: Internal Medicine

## 2023-07-27 ENCOUNTER — Ambulatory Visit
Admission: RE | Admit: 2023-07-27 | Discharge: 2023-07-27 | Disposition: A | Discharge status: Home / Self Care | Payer: Medicare HMO | Source: Ambulatory Visit | Attending: Internal Medicine | Admitting: Internal Medicine

## 2023-07-27 DIAGNOSIS — C50412 Malignant neoplasm of upper-outer quadrant of left female breast: Secondary | ICD-10-CM | POA: Insufficient documentation

## 2023-07-27 DIAGNOSIS — C50811 Malignant neoplasm of overlapping sites of right female breast: Secondary | ICD-10-CM | POA: Insufficient documentation

## 2023-07-27 DIAGNOSIS — Z170 Estrogen receptor positive status [ER+]: Secondary | ICD-10-CM

## 2023-08-20 ENCOUNTER — Other Ambulatory Visit: Payer: Medicare HMO

## 2023-08-20 ENCOUNTER — Ambulatory Visit: Payer: Medicare HMO | Admitting: Internal Medicine

## 2023-08-23 ENCOUNTER — Encounter: Payer: Self-pay | Admitting: Internal Medicine

## 2023-08-23 ENCOUNTER — Inpatient Hospital Stay: Payer: Medicare HMO | Admitting: Internal Medicine

## 2023-08-23 ENCOUNTER — Inpatient Hospital Stay: Payer: Medicare HMO | Attending: Internal Medicine

## 2023-08-23 VITALS — BP 145/86 | HR 84 | Temp 98.6°F | Resp 18 | Ht 62.0 in | Wt 145.8 lb

## 2023-08-23 DIAGNOSIS — C50811 Malignant neoplasm of overlapping sites of right female breast: Secondary | ICD-10-CM

## 2023-08-23 DIAGNOSIS — C50511 Malignant neoplasm of lower-outer quadrant of right female breast: Secondary | ICD-10-CM | POA: Insufficient documentation

## 2023-08-23 DIAGNOSIS — M810 Age-related osteoporosis without current pathological fracture: Secondary | ICD-10-CM | POA: Insufficient documentation

## 2023-08-23 DIAGNOSIS — Z9221 Personal history of antineoplastic chemotherapy: Secondary | ICD-10-CM | POA: Insufficient documentation

## 2023-08-23 DIAGNOSIS — C50412 Malignant neoplasm of upper-outer quadrant of left female breast: Secondary | ICD-10-CM | POA: Insufficient documentation

## 2023-08-23 DIAGNOSIS — Z79811 Long term (current) use of aromatase inhibitors: Secondary | ICD-10-CM | POA: Diagnosis not present

## 2023-08-23 DIAGNOSIS — F03B0 Unspecified dementia, moderate, without behavioral disturbance, psychotic disturbance, mood disturbance, and anxiety: Secondary | ICD-10-CM | POA: Insufficient documentation

## 2023-08-23 DIAGNOSIS — Z170 Estrogen receptor positive status [ER+]: Secondary | ICD-10-CM | POA: Insufficient documentation

## 2023-08-23 DIAGNOSIS — E559 Vitamin D deficiency, unspecified: Secondary | ICD-10-CM | POA: Insufficient documentation

## 2023-08-23 DIAGNOSIS — Z923 Personal history of irradiation: Secondary | ICD-10-CM | POA: Diagnosis not present

## 2023-08-23 DIAGNOSIS — Z79899 Other long term (current) drug therapy: Secondary | ICD-10-CM | POA: Diagnosis not present

## 2023-08-23 LAB — CMP (CANCER CENTER ONLY)
ALT: 22 U/L (ref 0–44)
AST: 23 U/L (ref 15–41)
Albumin: 4 g/dL (ref 3.5–5.0)
Alkaline Phosphatase: 61 U/L (ref 38–126)
Anion gap: 9 (ref 5–15)
BUN: 17 mg/dL (ref 8–23)
CO2: 27 mmol/L (ref 22–32)
Calcium: 9.8 mg/dL (ref 8.9–10.3)
Chloride: 106 mmol/L (ref 98–111)
Creatinine: 1.32 mg/dL — ABNORMAL HIGH (ref 0.44–1.00)
GFR, Estimated: 38 mL/min — ABNORMAL LOW (ref >60)
Glucose, Bld: 93 mg/dL (ref 70–99)
Potassium: 4.7 mmol/L (ref 3.5–5.1)
Sodium: 142 mmol/L (ref 135–145)
Total Bilirubin: 0.7 mg/dL (ref 0.0–1.2)
Total Protein: 7.1 g/dL (ref 6.5–8.1)

## 2023-08-23 LAB — CBC WITH DIFFERENTIAL (CANCER CENTER ONLY)
Abs Immature Granulocytes: 0.02 10*3/uL (ref 0.00–0.07)
Basophils Absolute: 0 10*3/uL (ref 0.0–0.1)
Basophils Relative: 0 %
Eosinophils Absolute: 0.1 10*3/uL (ref 0.0–0.5)
Eosinophils Relative: 1 %
HCT: 45.5 % (ref 36.0–46.0)
Hemoglobin: 15 g/dL (ref 12.0–15.0)
Immature Granulocytes: 0 %
Lymphocytes Relative: 41 %
Lymphs Abs: 2.4 10*3/uL (ref 0.7–4.0)
MCH: 33 pg (ref 26.0–34.0)
MCHC: 33 g/dL (ref 30.0–36.0)
MCV: 100.2 fL — ABNORMAL HIGH (ref 80.0–100.0)
Monocytes Absolute: 0.4 10*3/uL (ref 0.1–1.0)
Monocytes Relative: 6 %
Neutro Abs: 3 10*3/uL (ref 1.7–7.7)
Neutrophils Relative %: 52 %
Platelet Count: 180 10*3/uL (ref 150–400)
RBC: 4.54 MIL/uL (ref 3.87–5.11)
RDW: 14.1 % (ref 11.5–15.5)
WBC Count: 5.9 10*3/uL (ref 4.0–10.5)
nRBC: 0 % (ref 0.0–0.2)

## 2023-08-23 NOTE — Progress Notes (Signed)
 [facility] CONSULT NOTE  Patient Care Team: Marina Goodell, MD as PCP - General (Family Medicine) Earna Coder, MD as Consulting Physician (Oncology)  CHIEF COMPLAINTS/PURPOSE OF CONSULTATION: Breast cancer  #  Oncology History Overview Note  # 2011-  Carcinoma of breast status post lumpectomyestrogen receptor positive, progesterone receptor positive.  Positive margin with ductal carcinoma in situ. [Dr.Smith] 2. Oncotype DX score is 28 with recurring score is high in between 14%-22% on average 18 %. 3. Cytoxan and Taxotere for 4 cycle 4. Followed by radiation therapy finished in December of 2011  5. Started on Femara.  December of 2011 6. Changed to Arimidex (insurance request) 7/finishing Arimidex  in May of 2016 ---------------------   INVASIVE MAMMARY CARCINOMA, SEE NOTE - TUBULE FORMATION: SCORE 3 - NUCLEAR PLEOMORPHISM: SCORE 2 - MITOTIC COUNT: SCORE 1 - TOTAL SCORE: 6 - OVERALL GRADE: 2 - LYMPHOVASCULAR INVASION: NOT IDENTIFIED - CANCER LENGTH: 1.1 CM - CALCIFICATIONS: NOT IDENTIFIED  Targeted right breast ultrasound is performed demonstrating 3 o'clock retroareolar location lobulated hypoechoic solid-appearing mass measuring 1.2 x 0.8 x 0.9 cm. This finding corresponds to the mammographically seen mass  # JUNE 2024-Right breast cancer stage I- cT1b cN0-ER/PR positive HER2 negative- declined surgery given the risk of anesthesia/surgery [Dr.Cintron]. AUG 2024-  currently on anastrazole.       Malignant neoplasm of overlapping sites of right breast in female, estrogen receptor positive (HCC)  Carcinoma of upper-outer quadrant of left breast in female, estrogen receptor positive (HCC)  01/20/2016 Initial Diagnosis   Malignant neoplasm of upper-outer quadrant of left female breast (HCC)   01/13/2023 Cancer Staging   Staging form: Breast, AJCC 7th Edition - Clinical: Stage IA (T1b, N0, M0) - Signed by Earna Coder, MD on  01/13/2023 Histologic grade (G): G2 Estrogen receptor status: Positive Progesterone receptor status: Positive HER2 status: Negative      HISTORY OF PRESENTING ILLNESS: Patient ambulating- with assistance- cane. Accompanied by family- son.   Victoria Prince 89 y.o.  female mild to moderate dementia diagnosis of INTACT right breast cancer-stage I ER/PR positive HER2 negative [July 2024;declined/not candidate for surgery]-currently on anastrozole is here for follow-up/  and review results of the mammogram/US.   No worsening joint pains or hot flashes.  No fall.  Continues to be compliant with her anastrozole. Otherwise patient denies any new lumps or bumps. Patient's son lives with the patient.  Patient started on vitamin D at last visit.  However did not start Fosamax given concerns of side effects.  Review of Systems  Constitutional:  Negative for chills, diaphoresis, fever, malaise/fatigue and weight loss.  HENT:  Negative for nosebleeds and sore throat.   Eyes:  Negative for double vision.  Respiratory:  Negative for cough, hemoptysis, sputum production, shortness of breath and wheezing.   Cardiovascular:  Negative for chest pain, palpitations, orthopnea and leg swelling.  Gastrointestinal:  Negative for abdominal pain, blood in stool, constipation, diarrhea, heartburn, melena, nausea and vomiting.  Genitourinary:  Negative for dysuria, frequency and urgency.  Musculoskeletal:  Positive for back pain and joint pain.  Skin: Negative.  Negative for itching and rash.  Neurological:  Negative for dizziness, tingling, focal weakness, weakness and headaches.  Endo/Heme/Allergies:  Does not bruise/bleed easily.  Psychiatric/Behavioral:  Negative for depression. The patient is not nervous/anxious and does not have insomnia.      MEDICAL HISTORY:  Past Medical History:  Diagnosis Date   Breast cancer [facility]) 2011   left breast lumpectomy  with rad and chemo tx   Personal history of  chemotherapy 2011   left breast   Personal history of radiation therapy 2011   left breast   Radiation 2011   S/P chemotherapy, time since greater than 12 weeks 2011    SURGICAL HISTORY: Past Surgical History:  Procedure Laterality Date   BREAST BIOPSY Left 2012   negative   BREAST BIOPSY Left 2011   2 areas removed. Retroaerola area benign.  UOQ was DCIS with positive margin per Dr. Donneta Romberg notes.    BREAST BIOPSY Right 12/26/2019   [place] bx, Q marker, PREDOMINANTLY SCLEROTIC FIBROUS STROMA WITH RARE ATROPHIC MAMMARY   BREAST BIOPSY Right 01/06/2023   [place] Core Bx. coil clip- path pending   BREAST BIOPSY Right 01/06/2023   [place] RT BREAST BX W LOC DEV 1ST LESION IMG BX SPEC US GUIDE 01/06/2023 [facility]   BREAST LUMPECTOMY Left    SKIN LESION EXCISION  06/2021    SOCIAL HISTORY: Social History   Socioeconomic History   Marital status: Married    Spouse name: Not on file   Number of children: Not on file   Years of education: Not on file   Highest education level: Not on file  Occupational History   Not on file  Tobacco Use   Smoking status: Never   Smokeless tobacco: Never  Vaping Use   Vaping status: Never Used  Substance and Sexual Activity   Alcohol use: Never   Drug use: Never   Sexual activity: Not on file  Other Topics Concern   Not on file  Social History Narrative   Not on file   Social Drivers of Health   Financial Resource Strain: Low Risk  (07/19/2023)   Received from [facility]   Overall Financial Resource Strain (CARDIA)    Difficulty of Paying Living Expenses: Not hard at all  Food Insecurity: No Food Insecurity (07/19/2023)   Received from [facility]   Hunger Vital Sign    Worried About Running Out of Food in the Last Year: Never true    Ran Out of Food in the Last Year: Never true  Transportation Needs: No Transportation Needs (07/19/2023)   Received from [facility] -  Transportation    In the past 12 months, has lack of transportation kept you from medical appointments or from getting medications?: No    Lack of Transportation (Non-Medical): No  Physical Activity: Not on file  Stress: Not on file  Social Connections: Not on file  Intimate Partner Violence: Not At Risk (03/09/2022)   Humiliation, Afraid, Rape, and Kick questionnaire    Fear of Current or Ex-Partner: No    Emotionally Abused: No    Physically Abused: No    Sexually Abused: No    FAMILY HISTORY: Family History  Problem Relation Age of Onset   Breast cancer Neg Hx     ALLERGIES:  is allergic to sulfa antibiotics.  MEDICATIONS:  Current Outpatient Medications  Medication Sig Dispense Refill   acetaminophen (TYLENOL) 325 MG tablet Take 650 mg by mouth every 6 (six) hours as needed.     amitriptyline (ELAVIL) 25 MG tablet Take 25 mg by mouth at bedtime.      amLODipine (NORVASC) 2.5 MG tablet Take 2.5 mg by mouth daily.      anastrozole (ARIMIDEX) 1 MG tablet Take 1 tablet (1 mg total) by mouth daily. 90 tablet 1   cetirizine (ZYRTEC) 10  MG tablet Take 1 tablet by mouth daily.     ergocalciferol (VITAMIN D2) 1.25 MG (50000 UT) capsule Take 1 capsule (50,000 Units total) by mouth once a week. 12 capsule 1   mirtazapine (REMERON) 15 MG tablet Take 15 mg by mouth at bedtime.     oxyCODONE (OXY IR/ROXICODONE) 5 MG immediate release tablet Take 1 tablet (5 mg total) by mouth every 6 (six) hours as needed for moderate pain. 10 tablet 0   pregabalin (LYRICA) 25 MG capsule Take 1 capsule (25 mg total) by mouth at bedtime. 30 capsule 1   senna-docusate (SENOKOT-S) 8.6-50 MG tablet Take 1 tablet by mouth at bedtime as needed for mild constipation. 30 tablet 0   triamcinolone cream (KENALOG) 0.5 % Apply topically as directed. 30 g 1   No current facility-administered medications for this visit.     PHYSICAL EXAMINATION:  Vitals:   08/23/23 1506  BP: (!) 145/86  Pulse: 84  Resp: 18   Temp: 98.6 F (37 C)  SpO2: 95%     Filed Weights   08/23/23 1506  Weight: 145 lb 12.8 oz (66.1 kg)      Physical Exam Vitals and nursing note reviewed.  HENT:     Head: Normocephalic and atraumatic.     Mouth/Throat:     Pharynx: Oropharynx is clear.  Eyes:     Extraocular Movements: Extraocular movements intact.     Pupils: Pupils are equal, round, and reactive to light.  Cardiovascular:     Rate and Rhythm: Normal rate and regular rhythm.  Pulmonary:     Comments: Decreased breath sounds bilaterally.  Abdominal:     Palpations: Abdomen is soft.  Musculoskeletal:        General: Normal range of motion.     Cervical back: Normal range of motion.  Skin:    General: Skin is warm.  Neurological:     General: No focal deficit present.     Mental Status: She is alert and oriented to person, place, and time.  Psychiatric:        Behavior: Behavior normal.        Judgment: Judgment normal.      LABORATORY DATA:  I have reviewed the data as listed Lab Results  Component Value Date   WBC 5.9 08/23/2023   HGB 15.0 08/23/2023   HCT 45.5 08/23/2023   MCV 100.2 (H) 08/23/2023   PLT 180 08/23/2023   Recent Labs    12/29/22 0949 05/12/23 1359 08/23/23 1510  NA 140 141 142  K 4.6 4.0 4.7  CL 106 105 106  CO2 27 25 27   GLUCOSE 93 99 93  BUN 18 16 17   CREATININE 1.13* 1.34* 1.32*  CALCIUM 9.4 9.4 9.8  GFRNONAA 46* 37* 38*  PROT 7.0 6.9 7.1  ALBUMIN 4.0 4.0 4.0  AST 19 22 23   ALT 13 17 22   ALKPHOS 62 64 61  BILITOT 0.4 0.4 0.7    RADIOGRAPHIC STUDIES: I have personally reviewed the radiological images as listed and agreed with the findings in the report. MM DIAG BREAST TOMO UNI RIGHT Result Date: 07/27/2023 CLINICAL DATA:  89 year old female with biopsy-proven malignancy in the RIGHT breast (coil clip), presenting for assessment of treatment response. Her cancer was diagnosed via ultrasound-guided biopsy on January 06, 2023. Given age, moderate dementia, and  risk of anesthesia, patient and family have declined surgery. The patient is currently on anastrozole. Of note, she is status post a benign right breast biopsy  in 2021 (Q clip). EXAM: DIGITAL DIAGNOSTIC UNILATERAL RIGHT MAMMOGRAM WITH TOMOSYNTHESIS AND CAD; ULTRASOUND RIGHT BREAST LIMITED TECHNIQUE: Right digital diagnostic mammography and breast tomosynthesis was performed. The images were evaluated with computer-aided detection. ; Targeted ultrasound examination of the right breast was performed COMPARISON:  Previous exam(s). ACR Breast Density Category b: There are scattered areas of fibroglandular density. FINDINGS: Mammogram: There is a stable spiculated mass in the inner retroareolar right breast, measuring up to 10 mm, previously measuring up to 11 mm on December 28, 2022. There is an associated coil shaped biopsy marking clip, demarcating site of biopsy-proven malignancy. Stable positioning of the Q shaped biopsy marking clip in the upper retroareolar right breast, demarcating site of previous benign biopsy. No new suspicious mass, calcification, or other findings are identified in the right breast. Ultrasound: Targeted right breast ultrasound was performed. At 3 o'clock in the retroareolar position, there is an irregular hypoechoic mass. It measures 11 x 8 x 9 mm, stable in size compared to December 31, 2022. There is an associated biopsy marking clip, consistent with biopsy-proven malignancy. IMPRESSION: 1. Stable size of the biopsy-proven malignancy in the RIGHT breast 3 o'clock retroareolar position, measuring up to 11 mm. 2. No new suspicious findings in the RIGHT breast. RECOMMENDATION: Continued clinical management for the patient's known RIGHT breast malignancy. I have discussed the findings and recommendations with the patient. If applicable, a reminder letter will be sent to the patient regarding the next appointment. BI-RADS CATEGORY  6: Known biopsy-proven malignancy. Electronically Signed   By: Jacob Moores M.D.   On: 07/27/2023 11:16   US Breast Limited Uni Right Inc Axilla Result Date: 07/27/2023 CLINICAL DATA:  89 year old female with biopsy-proven malignancy in the RIGHT breast (coil clip), presenting for assessment of treatment response. Her cancer was diagnosed via ultrasound-guided biopsy on January 06, 2023. Given age, moderate dementia, and risk of anesthesia, patient and family have declined surgery. The patient is currently on anastrozole. Of note, she is status post a benign right breast biopsy in 2021 (Q clip). EXAM: DIGITAL DIAGNOSTIC UNILATERAL RIGHT MAMMOGRAM WITH TOMOSYNTHESIS AND CAD; ULTRASOUND RIGHT BREAST LIMITED TECHNIQUE: Right digital diagnostic mammography and breast tomosynthesis was performed. The images were evaluated with computer-aided detection. ; Targeted ultrasound examination of the right breast was performed COMPARISON:  Previous exam(s). ACR Breast Density Category b: There are scattered areas of fibroglandular density. FINDINGS: Mammogram: There is a stable spiculated mass in the inner retroareolar right breast, measuring up to 10 mm, previously measuring up to 11 mm on December 28, 2022. There is an associated coil shaped biopsy marking clip, demarcating site of biopsy-proven malignancy. Stable positioning of the Q shaped biopsy marking clip in the upper retroareolar right breast, demarcating site of previous benign biopsy. No new suspicious mass, calcification, or other findings are identified in the right breast. Ultrasound: Targeted right breast ultrasound was performed. At 3 o'clock in the retroareolar position, there is an irregular hypoechoic mass. It measures 11 x 8 x 9 mm, stable in size compared to December 31, 2022. There is an associated biopsy marking clip, consistent with biopsy-proven malignancy. IMPRESSION: 1. Stable size of the biopsy-proven malignancy in the RIGHT breast 3 o'clock retroareolar position, measuring up to 11 mm. 2. No new suspicious findings in the  RIGHT breast. RECOMMENDATION: Continued clinical management for the patient's known RIGHT breast malignancy. I have discussed the findings and recommendations with the patient. If applicable, a reminder letter will be sent to the patient regarding the next  appointment. BI-RADS CATEGORY  6: Known biopsy-proven malignancy. Electronically Signed   By: Jacob Moores M.D.   On: 07/27/2023 11:16    ASSESSMENT & PLAN:   Malignant neoplasm of overlapping sites of right breast in female, estrogen receptor positive (HCC) # JULY 2024- INTACT BREAST PRIMARY- Right breast cancer stage I- cT1b cN0-ER/PR positive HER2 negative- declined surgery given the risk of anesthesia/surgery [Dr.Cintron]. currently on anastrazole.  US/Mammo- FEB 2nd, 2025-  Stable size of the biopsy-proven malignancy in the RIGHT breast 3 o'clock retroareolar position, measuring up to 11 mm. 2. No new suspicious findings in the RIGHT breast.  # vit D deficiency- NOV 2024- BMD: OSTEOPOROSIS- T-score of -2.8. continue  ca+vit D; on Fosamax every 2 weeks given GFR of 37.  Again reminded compliance with fosamax.  #2024 -vitamin D 7.7.  Called in vit D 50K/weekly-will check vitamin D levels at next visit.  # Back pain/sciatica [ sep 2023- [facility]]- s/p injection-currently improved- stable.  # Mild to moderate dementia/with episodes of confusion especially in the evenings- stable.  # DISPOSITION: # follow up in 3 months- MD: labs- cbc/cmp; vit D 25 [place]  Dr.B   All questions were answered. The patient/family knows to call the clinic with any problems, questions or concerns.    Earna Coder, MD 08/23/2023 3:46 PM

## 2023-08-23 NOTE — Assessment & Plan Note (Addendum)
#   JULY 2024- INTACT BREAST PRIMARY- Right breast cancer stage I- cT1b cN0-ER/PR positive HER2 negative- declined surgery given the risk of anesthesia/surgery [Dr.Cintron]. currently on anastrazole.  US/Mammo- FEB 2nd, 2025-  Stable size of the biopsy-proven malignancy in the RIGHT breast 3 o'clock retroareolar position, measuring up to 11 mm. 2. No new suspicious findings in the RIGHT breast.  # vit D deficiency- NOV 2024- BMD: OSTEOPOROSIS- T-score of -2.8. continue  ca+vit D; on Fosamax every 2 weeks given GFR of 37.  Again reminded compliance with fosamax.  #2024 -vitamin D 7.7.  Called in vit D 50K/weekly-will check vitamin D levels at next visit.  # Back pain/sciatica [ sep 2023- [facility]]- s/p injection-currently improved- stable.  # Mild to moderate dementia/with episodes of confusion especially in the evenings- stable.  # DISPOSITION: # follow up in 3 months- MD: labs- cbc/cmp; vit D 25 [place]  Dr.B

## 2023-08-23 NOTE — Progress Notes (Signed)
 Mammogram and u/s 07/27/23.

## 2023-11-01 ENCOUNTER — Other Ambulatory Visit: Payer: Self-pay | Admitting: Internal Medicine

## 2023-11-23 ENCOUNTER — Encounter: Payer: Self-pay | Admitting: Internal Medicine

## 2023-11-23 ENCOUNTER — Inpatient Hospital Stay: Attending: Internal Medicine

## 2023-11-23 ENCOUNTER — Inpatient Hospital Stay (HOSPITAL_BASED_OUTPATIENT_CLINIC_OR_DEPARTMENT_OTHER): Admitting: Internal Medicine

## 2023-11-23 VITALS — BP 128/68 | HR 70 | Temp 97.7°F | Resp 16 | Ht 62.0 in | Wt 149.0 lb

## 2023-11-23 DIAGNOSIS — C50811 Malignant neoplasm of overlapping sites of right female breast: Secondary | ICD-10-CM

## 2023-11-23 DIAGNOSIS — Z170 Estrogen receptor positive status [ER+]: Secondary | ICD-10-CM

## 2023-11-23 DIAGNOSIS — Z923 Personal history of irradiation: Secondary | ICD-10-CM | POA: Insufficient documentation

## 2023-11-23 DIAGNOSIS — E559 Vitamin D deficiency, unspecified: Secondary | ICD-10-CM | POA: Insufficient documentation

## 2023-11-23 DIAGNOSIS — M810 Age-related osteoporosis without current pathological fracture: Secondary | ICD-10-CM | POA: Diagnosis not present

## 2023-11-23 DIAGNOSIS — Z79899 Other long term (current) drug therapy: Secondary | ICD-10-CM | POA: Insufficient documentation

## 2023-11-23 DIAGNOSIS — Z79811 Long term (current) use of aromatase inhibitors: Secondary | ICD-10-CM | POA: Insufficient documentation

## 2023-11-23 DIAGNOSIS — C50511 Malignant neoplasm of lower-outer quadrant of right female breast: Secondary | ICD-10-CM | POA: Diagnosis present

## 2023-11-23 DIAGNOSIS — Z9221 Personal history of antineoplastic chemotherapy: Secondary | ICD-10-CM | POA: Diagnosis not present

## 2023-11-23 DIAGNOSIS — C50412 Malignant neoplasm of upper-outer quadrant of left female breast: Secondary | ICD-10-CM | POA: Diagnosis present

## 2023-11-23 DIAGNOSIS — F03B0 Unspecified dementia, moderate, without behavioral disturbance, psychotic disturbance, mood disturbance, and anxiety: Secondary | ICD-10-CM | POA: Insufficient documentation

## 2023-11-23 DIAGNOSIS — M549 Dorsalgia, unspecified: Secondary | ICD-10-CM | POA: Diagnosis not present

## 2023-11-23 LAB — CMP (CANCER CENTER ONLY)
ALT: 20 U/L (ref 0–44)
AST: 25 U/L (ref 15–41)
Albumin: 3.9 g/dL (ref 3.5–5.0)
Alkaline Phosphatase: 72 U/L (ref 38–126)
Anion gap: 9 (ref 5–15)
BUN: 19 mg/dL (ref 8–23)
CO2: 25 mmol/L (ref 22–32)
Calcium: 9.4 mg/dL (ref 8.9–10.3)
Chloride: 105 mmol/L (ref 98–111)
Creatinine: 1.42 mg/dL — ABNORMAL HIGH (ref 0.44–1.00)
GFR, Estimated: 34 mL/min — ABNORMAL LOW (ref >60)
Glucose, Bld: 89 mg/dL (ref 70–99)
Potassium: 4.3 mmol/L (ref 3.5–5.1)
Sodium: 139 mmol/L (ref 135–145)
Total Bilirubin: 0.6 mg/dL (ref 0.0–1.2)
Total Protein: 7.1 g/dL (ref 6.5–8.1)

## 2023-11-23 LAB — CBC WITH DIFFERENTIAL (CANCER CENTER ONLY)
Abs Immature Granulocytes: 0.02 10*3/uL (ref 0.00–0.07)
Basophils Absolute: 0 10*3/uL (ref 0.0–0.1)
Basophils Relative: 0 %
Eosinophils Absolute: 0.1 10*3/uL (ref 0.0–0.5)
Eosinophils Relative: 1 %
HCT: 44.3 % (ref 36.0–46.0)
Hemoglobin: 14.4 g/dL (ref 12.0–15.0)
Immature Granulocytes: 0 %
Lymphocytes Relative: 38 %
Lymphs Abs: 2.4 10*3/uL (ref 0.7–4.0)
MCH: 32.5 pg (ref 26.0–34.0)
MCHC: 32.5 g/dL (ref 30.0–36.0)
MCV: 100 fL (ref 80.0–100.0)
Monocytes Absolute: 0.4 10*3/uL (ref 0.1–1.0)
Monocytes Relative: 6 %
Neutro Abs: 3.5 10*3/uL (ref 1.7–7.7)
Neutrophils Relative %: 55 %
Platelet Count: 193 10*3/uL (ref 150–400)
RBC: 4.43 MIL/uL (ref 3.87–5.11)
RDW: 13.6 % (ref 11.5–15.5)
WBC Count: 6.3 10*3/uL (ref 4.0–10.5)
nRBC: 0 % (ref 0.0–0.2)

## 2023-11-23 LAB — VITAMIN D 25 HYDROXY (VIT D DEFICIENCY, FRACTURES): Vit D, 25-Hydroxy: 54.27 ng/mL (ref 30–100)

## 2023-11-23 NOTE — Progress Notes (Signed)
 [facility] CONSULT NOTE  Patient Care Team: Lorrie Rothman, MD as PCP - General (Family Medicine) Gwyn Leos, MD as Consulting Physician (Oncology)  CHIEF COMPLAINTS/PURPOSE OF CONSULTATION: Breast cancer  #  Oncology History Overview Note  # 2011-  Carcinoma of breast status post lumpectomyestrogen receptor positive, progesterone receptor positive.  Positive margin with ductal carcinoma in situ. [Dr.Smith] 2. Oncotype DX score is 28 with recurring score is high in between 14%-22% on average 18 %. 3. Cytoxan and Taxotere for 4 cycle 4. Followed by radiation therapy finished in December of 2011  5. Started on Femara.  December of 2011 6. Changed to Arimidex  (insurance request) 7/finishing Arimidex   in May of 2016 ---------------------   INVASIVE MAMMARY CARCINOMA, SEE NOTE - TUBULE FORMATION: SCORE 3 - NUCLEAR PLEOMORPHISM: SCORE 2 - MITOTIC COUNT: SCORE 1 - TOTAL SCORE: 6 - OVERALL GRADE: 2 - LYMPHOVASCULAR INVASION: NOT IDENTIFIED - CANCER LENGTH: 1.1 CM - CALCIFICATIONS: NOT IDENTIFIED  Targeted right breast ultrasound is performed demonstrating 3 o'clock retroareolar location lobulated hypoechoic solid-appearing mass measuring 1.2 x 0.8 x 0.9 cm. This finding corresponds to the mammographically seen mass  # JUNE 2024-Right breast cancer stage I- cT1b cN0-ER/PR positive HER2 negative- declined surgery given the risk of anesthesia/surgery [Dr.Cintron]. AUG 2024-  currently on anastrazole.       Malignant neoplasm of overlapping sites of right breast in female, estrogen receptor positive (HCC)  Carcinoma of upper-outer quadrant of left breast in female, estrogen receptor positive (HCC)  01/20/2016 Initial Diagnosis   Malignant neoplasm of upper-outer quadrant of left female breast (HCC)   01/13/2023 Cancer Staging   Staging form: Breast, AJCC 7th Edition - Clinical: Stage IA (T1b, N0, M0) - Signed by Gwyn Leos, MD on  01/13/2023 Histologic grade (G): G2 Estrogen receptor status: Positive Progesterone receptor status: Positive HER2 status: Negative      HISTORY OF PRESENTING ILLNESS: Patient ambulating- with assistance- cane. Accompanied by family- son.   Victoria Prince 89 y.o.  female mild to moderate dementia diagnosis of INTACT right breast cancer-stage I ER/PR positive HER2 negative [July 2024;declined/not candidate for surgery]-currently on anastrozole  is here for follow-up.  No worsening joint pains or hot flashes.  No fall.  Continues to be compliant with her anastrozole . Otherwise patient denies any new lumps or bumps. Patient's son lives with the patient.  Review of Systems  Constitutional:  Negative for chills, diaphoresis, fever, malaise/fatigue and weight loss.  HENT:  Negative for nosebleeds and sore throat.   Eyes:  Negative for double vision.  Respiratory:  Negative for cough, hemoptysis, sputum production, shortness of breath and wheezing.   Cardiovascular:  Negative for chest pain, palpitations, orthopnea and leg swelling.  Gastrointestinal:  Negative for abdominal pain, blood in stool, constipation, diarrhea, heartburn, melena, nausea and vomiting.  Genitourinary:  Negative for dysuria, frequency and urgency.  Musculoskeletal:  Positive for back pain and joint pain.  Skin: Negative.  Negative for itching and rash.  Neurological:  Negative for dizziness, tingling, focal weakness, weakness and headaches.  Endo/Heme/Allergies:  Does not bruise/bleed easily.  Psychiatric/Behavioral:  Negative for depression. The patient is not nervous/anxious and does not have insomnia.      MEDICAL HISTORY:  Past Medical History:  Diagnosis Date   Breast cancer (HCC) 2011   left breast lumpectomy with rad and chemo tx   Personal history of chemotherapy 2011   left breast   Personal history of radiation therapy 2011   left breast  Radiation 2011   S/P chemotherapy, time since greater than  12 weeks 2011    SURGICAL HISTORY: Past Surgical History:  Procedure Laterality Date   BREAST BIOPSY Left 2012   negative   BREAST BIOPSY Left 2011   2 areas removed. Retroaerola area benign.  UOQ was DCIS with positive margin per Dr. Valentine Gasmen notes.    BREAST BIOPSY Right 12/26/2019   us  bx, Q marker, PREDOMINANTLY SCLEROTIC FIBROUS STROMA WITH RARE ATROPHIC MAMMARY   BREAST BIOPSY Right 01/06/2023   Us  Core Bx. coil clip- path pending   BREAST BIOPSY Right 01/06/2023   US  RT BREAST BX W LOC DEV 1ST LESION IMG BX SPEC US  GUIDE 01/06/2023 [facility]   BREAST LUMPECTOMY Left    SKIN LESION EXCISION  06/2021    SOCIAL HISTORY: Social History   Socioeconomic History   Marital status: Married    Spouse name: Not on file   Number of children: Not on file   Years of education: Not on file   Highest education level: Not on file  Occupational History   Not on file  Tobacco Use   Smoking status: Never   Smokeless tobacco: Never  Vaping Use   Vaping status: Never Used  Substance and Sexual Activity   Alcohol  use: Never   Drug use: Never   Sexual activity: Not on file  Other Topics Concern   Not on file  Social History Narrative   Not on file   Social Drivers of Health   Financial Resource Strain: Low Risk  (07/19/2023)   [facility]   Overall Financial Resource Strain (CARDIA)    Difficulty of Paying Living Expenses: Not hard at all  Food Insecurity: No Food Insecurity (07/19/2023)   Received from [facility]   Hunger Vital Sign    Worried About Running Out of Food in the Last Year: Never true    Ran Out of Food in the Last Year: Never true  Transportation Needs: No Transportation Needs (07/19/2023)   Received from [facility] - Transportation    In the past 12 months, has lack of transportation kept you from medical appointments or from getting medications?: No    Lack of Transportation  (Non-Medical): No  Physical Activity: Not on file  Stress: Not on file  Social Connections: Not on file  Intimate Partner Violence: Not At Risk (03/09/2022)   Humiliation, Afraid, Rape, and Kick questionnaire    Fear of Current or Ex-Partner: No    Emotionally Abused: No    Physically Abused: No    Sexually Abused: No    FAMILY HISTORY: Family History  Problem Relation Age of Onset   Breast cancer Neg Hx     ALLERGIES:  is allergic to sulfa antibiotics.  MEDICATIONS:  Current Outpatient Medications  Medication Sig Dispense Refill   acetaminophen (TYLENOL) 325 MG tablet Take 650 mg by mouth every 6 (six) hours as needed.     alendronate  (FOSAMAX ) 70 MG tablet Take by mouth.     amitriptyline  (ELAVIL ) 25 MG tablet Take 25 mg by mouth at bedtime.      amLODipine  (NORVASC ) 2.5 MG tablet Take 2.5 mg by mouth daily.      anastrozole  (ARIMIDEX ) 1 MG tablet TAKE ONE (1) TABLET (1 MG TOTAL) BY MOUTH DAILY. 90 tablet 1   cetirizine (ZYRTEC) 10 MG tablet Take 1 tablet by mouth daily.     ergocalciferol  (VITAMIN D2) 1.25 MG (  50000 UT) capsule Take 1 capsule (50,000 Units total) by mouth once a week. 12 capsule 1   mirtazapine (REMERON) 15 MG tablet Take 15 mg by mouth at bedtime.     oxyCODONE  (OXY IR/ROXICODONE ) 5 MG immediate release tablet Take 1 tablet (5 mg total) by mouth every 6 (six) hours as needed for moderate pain. 10 tablet 0   pravastatin  (PRAVACHOL ) 10 MG tablet Take 10 mg by mouth at bedtime.     pregabalin  (LYRICA ) 25 MG capsule Take 1 capsule (25 mg total) by mouth at bedtime. 30 capsule 1   senna-docusate (SENOKOT-S) 8.6-50 MG tablet Take 1 tablet by mouth at bedtime as needed for mild constipation. 30 tablet 0   triamcinolone  cream (KENALOG ) 0.5 % Apply topically as directed. 30 g 1   No current facility-administered medications for this visit.     PHYSICAL EXAMINATION:  Vitals:   11/23/23 1515  BP: 128/68  Pulse: 70  Resp: 16  Temp: 97.7 F (36.5 C)  SpO2: 97%      Filed Weights   11/23/23 1515  Weight: 149 lb (67.6 kg)      Physical Exam Vitals and nursing note reviewed.  HENT:     Head: Normocephalic and atraumatic.     Mouth/Throat:     Pharynx: Oropharynx is clear.  Eyes:     Extraocular Movements: Extraocular movements intact.     Pupils: Pupils are equal, round, and reactive to light.  Cardiovascular:     Rate and Rhythm: Normal rate and regular rhythm.  Pulmonary:     Comments: Decreased breath sounds bilaterally.  Abdominal:     Palpations: Abdomen is soft.  Musculoskeletal:        General: Normal range of motion.     Cervical back: Normal range of motion.  Skin:    General: Skin is warm.  Neurological:     General: No focal deficit present.     Mental Status: She is alert and oriented to person, place, and time.  Psychiatric:        Behavior: Behavior normal.        Judgment: Judgment normal.      LABORATORY DATA:  I have reviewed the data as listed Lab Results  Component Value Date   WBC 6.3 11/23/2023   HGB 14.4 11/23/2023   HCT 44.3 11/23/2023   MCV 100.0 11/23/2023   PLT 193 11/23/2023   Recent Labs    05/12/23 1359 08/23/23 1510 11/23/23 1508  NA 141 142 139  K 4.0 4.7 4.3  CL 105 106 105  CO2 25 27 25   GLUCOSE 99 93 89  BUN 16 17 19   CREATININE 1.34* 1.32* 1.42*  CALCIUM 9.4 9.8 9.4  GFRNONAA 37* 38* 34*  PROT 6.9 7.1 7.1  ALBUMIN 4.0 4.0 3.9  AST 22 23 25   ALT 17 22 20   ALKPHOS 64 61 72  BILITOT 0.4 0.7 0.6    RADIOGRAPHIC STUDIES: I have personally reviewed the radiological images as listed and agreed with the findings in the report. No results found.   ASSESSMENT & PLAN:   Malignant neoplasm of overlapping sites of right breast in female, estrogen receptor positive (HCC) # JULY 2024- INTACT BREAST PRIMARY- Right breast cancer stage I- cT1b cN0-ER/PR positive HER2 negative- DECLINED surgery given the risk of anesthesia/surgery [Dr.Cintron].  #  currently on anastrazole.   US /Mammo- FEB 2nd, 2025-  Stable size of the biopsy-proven malignancy in the RIGHT breast 3 o'clock retroareolar position, measuring up  to 11 mm. 2. No new suspicious findings in the RIGHT breast. Will repeat mammogram   # vit D deficiency- NOV 2024- BMD: OSTEOPOROSIS- T-score of -2.8. continue  ca+vit D; on Fosamax  every 2 weeks given GFR of 37. On fosamax .  #2024 -vitamin D  7.7.  Called in vit D 50K/weekly-  vitamin D  levels-pending.   # Back pain/sciatica [ sep 2023- [facility] hosp[facility]]- s/p injection-currently improved-  stable.  # Mild to moderate dementia/with episodes of confusion especially in the evenings- stable.  # DISPOSITION: # follow up in 3 months- MD: labs- cbc/cmp; vit D 25 [place]  Dr.B    All questions were answered. The patient/family knows to call the clinic with any problems, questions or concerns.    Gwyn Leos, MD 11/23/2023 4:24 PM

## 2023-11-23 NOTE — Assessment & Plan Note (Signed)
#   JULY 2024- INTACT BREAST PRIMARY- Right breast cancer stage I- cT1b cN0-ER/PR positive HER2 negative- DECLINED surgery given the risk of anesthesia/surgery [Dr.Cintron].  #  currently on anastrazole.  US /Mammo- FEB 2nd, 2025-  Stable size of the biopsy-proven malignancy in the RIGHT breast 3 o'clock retroareolar position, measuring up to 11 mm. 2. No new suspicious findings in the RIGHT breast. Will repeat mammogram   # vit D deficiency- NOV 2024- BMD: OSTEOPOROSIS- T-score of -2.8. continue  ca+vit D; on Fosamax  every 2 weeks given GFR of 37. On fosamax .  #2024 -vitamin D  7.7.  Called in vit D 50K/weekly-  vitamin D  levels-pending.   # Back pain/sciatica [ sep 2023- [facility] hosp[facility]]- s/p injection-currently improved-  stable.  # Mild to moderate dementia/with episodes of confusion especially in the evenings- stable.  # DISPOSITION: # follow up in 3 months- MD: labs- cbc/cmp; vit D 25 [place]  Dr.B

## 2023-11-23 NOTE — Progress Notes (Signed)
 Patient is doing well, no new questions or concerns for the doctor today.

## 2023-12-30 ENCOUNTER — Ambulatory Visit: Payer: Medicare HMO | Admitting: Internal Medicine

## 2023-12-30 ENCOUNTER — Other Ambulatory Visit: Payer: Medicare HMO

## 2024-02-07 ENCOUNTER — Ambulatory Visit: Payer: Self-pay

## 2024-02-29 ENCOUNTER — Inpatient Hospital Stay (HOSPITAL_BASED_OUTPATIENT_CLINIC_OR_DEPARTMENT_OTHER): Admitting: Internal Medicine

## 2024-02-29 ENCOUNTER — Inpatient Hospital Stay: Attending: Internal Medicine

## 2024-02-29 ENCOUNTER — Encounter: Payer: Self-pay | Admitting: Internal Medicine

## 2024-02-29 ENCOUNTER — Other Ambulatory Visit: Payer: Self-pay | Admitting: Nurse Practitioner

## 2024-02-29 DIAGNOSIS — M549 Dorsalgia, unspecified: Secondary | ICD-10-CM | POA: Diagnosis not present

## 2024-02-29 DIAGNOSIS — Z9221 Personal history of antineoplastic chemotherapy: Secondary | ICD-10-CM | POA: Insufficient documentation

## 2024-02-29 DIAGNOSIS — Z79811 Long term (current) use of aromatase inhibitors: Secondary | ICD-10-CM | POA: Insufficient documentation

## 2024-02-29 DIAGNOSIS — F03B0 Unspecified dementia, moderate, without behavioral disturbance, psychotic disturbance, mood disturbance, and anxiety: Secondary | ICD-10-CM | POA: Diagnosis not present

## 2024-02-29 DIAGNOSIS — Z170 Estrogen receptor positive status [ER+]: Secondary | ICD-10-CM

## 2024-02-29 DIAGNOSIS — C50412 Malignant neoplasm of upper-outer quadrant of left female breast: Secondary | ICD-10-CM | POA: Insufficient documentation

## 2024-02-29 DIAGNOSIS — M810 Age-related osteoporosis without current pathological fracture: Secondary | ICD-10-CM | POA: Insufficient documentation

## 2024-02-29 DIAGNOSIS — C50811 Malignant neoplasm of overlapping sites of right female breast: Secondary | ICD-10-CM | POA: Diagnosis not present

## 2024-02-29 DIAGNOSIS — Z79899 Other long term (current) drug therapy: Secondary | ICD-10-CM | POA: Insufficient documentation

## 2024-02-29 DIAGNOSIS — E559 Vitamin D deficiency, unspecified: Secondary | ICD-10-CM | POA: Insufficient documentation

## 2024-02-29 DIAGNOSIS — Z923 Personal history of irradiation: Secondary | ICD-10-CM | POA: Insufficient documentation

## 2024-02-29 DIAGNOSIS — C50511 Malignant neoplasm of lower-outer quadrant of right female breast: Secondary | ICD-10-CM | POA: Insufficient documentation

## 2024-02-29 DIAGNOSIS — K625 Hemorrhage of anus and rectum: Secondary | ICD-10-CM

## 2024-02-29 LAB — CMP (CANCER CENTER ONLY)
ALT: 20 U/L (ref 0–44)
AST: 30 U/L (ref 15–41)
Albumin: 3.9 g/dL (ref 3.5–5.0)
Alkaline Phosphatase: 69 U/L (ref 38–126)
Anion gap: 10 (ref 5–15)
BUN: 13 mg/dL (ref 8–23)
CO2: 25 mmol/L (ref 22–32)
Calcium: 9.4 mg/dL (ref 8.9–10.3)
Chloride: 104 mmol/L (ref 98–111)
Creatinine: 1.37 mg/dL — ABNORMAL HIGH (ref 0.44–1.00)
GFR, Estimated: 36 mL/min — ABNORMAL LOW (ref >60)
Glucose, Bld: 116 mg/dL — ABNORMAL HIGH (ref 70–99)
Potassium: 4.7 mmol/L (ref 3.5–5.1)
Sodium: 139 mmol/L (ref 135–145)
Total Bilirubin: 0.5 mg/dL (ref 0.0–1.2)
Total Protein: 6.6 g/dL (ref 6.5–8.1)

## 2024-02-29 LAB — CBC WITH DIFFERENTIAL (CANCER CENTER ONLY)
Abs Immature Granulocytes: 0.01 K/uL (ref 0.00–0.07)
Basophils Absolute: 0 K/uL (ref 0.0–0.1)
Basophils Relative: 0 %
Eosinophils Absolute: 0.1 K/uL (ref 0.0–0.5)
Eosinophils Relative: 2 %
HCT: 42.5 % (ref 36.0–46.0)
Hemoglobin: 13.6 g/dL (ref 12.0–15.0)
Immature Granulocytes: 0 %
Lymphocytes Relative: 45 %
Lymphs Abs: 2.8 K/uL (ref 0.7–4.0)
MCH: 32.2 pg (ref 26.0–34.0)
MCHC: 32 g/dL (ref 30.0–36.0)
MCV: 100.7 fL — ABNORMAL HIGH (ref 80.0–100.0)
Monocytes Absolute: 0.5 K/uL (ref 0.1–1.0)
Monocytes Relative: 7 %
Neutro Abs: 2.9 K/uL (ref 1.7–7.7)
Neutrophils Relative %: 46 %
Platelet Count: 178 K/uL (ref 150–400)
RBC: 4.22 MIL/uL (ref 3.87–5.11)
RDW: 14.7 % (ref 11.5–15.5)
WBC Count: 6.4 K/uL (ref 4.0–10.5)
nRBC: 0 % (ref 0.0–0.2)

## 2024-02-29 LAB — VITAMIN D 25 HYDROXY (VIT D DEFICIENCY, FRACTURES): Vit D, 25-Hydroxy: 28.79 ng/mL — ABNORMAL LOW (ref 30–100)

## 2024-02-29 NOTE — Assessment & Plan Note (Addendum)
#   JULY 2024- INTACT BREAST PRIMARY [SURGERY NOT DONE]- Right breast cancer stage I- cT1b cN0-ER/PR positive HER2 negative- DECLINED surgery given the risk of anesthesia/surgery [Dr.Cintron].  # US /Mammo- FEB 2nd, 2025-  Stable size of the biopsy-proven malignancy in the RIGHT breast 3 o'clock retroareolar position, measuring up to 11 mm. currently on anastrazole.  Continue anastrozole  for now.  Will repeat mammogram in 6 months.  # BRBPR- ? Dirverticular bleed- awaiting CT scan; Hb 13- stable.   # vit D deficiency- NOV 2024- BMD: OSTEOPOROSIS- T-score of -2.8. continue  ca+vit D; on Fosamax  every 2 weeks given GFR of 37. On fosamax .  #2024 -vitamin D  7.7.  Called in vit D 50K/weekly-  vitamin D  levels-pending.   # Back pain/sciatica [ sep 2023- briefly hospice]- s/p injection-currently improved-  stable.  # Mild to moderate dementia/with episodes of confusion especially in the evenings- stable.  # DISPOSITION: # Diagnostic mammogram/US  in feb 2026 # follow up in 6  months- MD: labs- cbc/cmp; vit D 25 [place]  Dr.B

## 2024-02-29 NOTE — Progress Notes (Signed)
 Patient son states she has passed blood through her bowel movement on yesterday. Patient son states there was little to no stool but a bunch of blood. Patient son states this is the second time this has happened within the last three weeks. They went to see her PCP yesterday and they have reccommended a ct scan which as was approved by insurance and they are now waiting on a date.

## 2024-02-29 NOTE — Progress Notes (Signed)
 [facility] CONSULT NOTE  Patient Care Team: Jeffie Cheryl BRAVO, MD as PCP - General (Family Medicine) Rennie Victoria SAUNDERS, MD as Consulting Physician (Oncology)  CHIEF COMPLAINTS/PURPOSE OF CONSULTATION: Breast cancer  #  Oncology History Overview Note  # 2011-  Carcinoma of breast status post lumpectomyestrogen receptor positive, progesterone receptor positive.  Positive margin with ductal carcinoma in situ. [Dr.Smith] 2. Oncotype DX score is 28 with recurring score is high in between 14%-22% on average 18 %. 3. Cytoxan and Taxotere for 4 cycle 4. Followed by radiation therapy finished in December of 2011  5. Started on Femara.  December of 2011 6. Changed to Arimidex  (insurance request) 7/finishing Arimidex   in May of 2016 ---------------------   INVASIVE MAMMARY CARCINOMA, SEE NOTE - TUBULE FORMATION: SCORE 3 - NUCLEAR PLEOMORPHISM: SCORE 2 - MITOTIC COUNT: SCORE 1 - TOTAL SCORE: 6 - OVERALL GRADE: 2 - LYMPHOVASCULAR INVASION: NOT IDENTIFIED - CANCER LENGTH: 1.1 CM - CALCIFICATIONS: NOT IDENTIFIED  Targeted right breast ultrasound is performed demonstrating 3 o'clock retroareolar location lobulated hypoechoic solid-appearing mass measuring 1.2 x 0.8 x 0.9 cm. This finding corresponds to the mammographically seen mass  # JUNE 2024-Right breast cancer stage I- cT1b cN0-ER/PR positive HER2 negative- declined surgery given the risk of anesthesia/surgery [Dr.Cintron]. AUG 2024-  currently on anastrazole.       Malignant neoplasm of overlapping sites of right breast in female, estrogen receptor positive (HCC)  Carcinoma of upper-outer quadrant of left breast in female, estrogen receptor positive (HCC)  01/20/2016 Initial Diagnosis   Malignant neoplasm of upper-outer quadrant of left female breast (HCC)   01/13/2023 Cancer Staging   Staging form: Breast, AJCC 7th Edition - Clinical: Stage IA (T1b, N0, M0) - Signed by Rennie Victoria SAUNDERS, MD on  01/13/2023 Histologic grade (G): G2 Estrogen receptor status: Positive Progesterone receptor status: Positive HER2 status: Negative      HISTORY OF PRESENTING ILLNESS: Patient ambulating- with assistance- cane. Accompanied by family- son.   Victoria Prince 89 y.o.  female mild to moderate dementia diagnosis of INTACT right breast cancer-stage I ER/PR positive HER2 negative [July 2024;declined/not candidate for surgery]-currently on anastrozole  is here for follow-up.  Patient son states she has passed blood through her bowel movement on yesterday. Patient son states there was little to no stool but a bunch of blood. Patient son states this is the second time this has happened within the last three weeks.   S/p evaluation with PCP yesterday and they have reccommended a ct scan which as was approved by insurance and they are now waiting on a date.    No worsening joint pains or hot flashes.  No fall.  Continues to be compliant with her anastrozole . Otherwise patient denies any new lumps or bumps. Patient's son lives with the patient.  Review of Systems  Constitutional:  Negative for chills, diaphoresis, fever, malaise/fatigue and weight loss.  HENT:  Negative for nosebleeds and sore throat.   Eyes:  Negative for double vision.  Respiratory:  Negative for cough, hemoptysis, sputum production, shortness of breath and wheezing.   Cardiovascular:  Negative for chest pain, palpitations, orthopnea and leg swelling.  Gastrointestinal:  Negative for abdominal pain, blood in stool, constipation, diarrhea, heartburn, melena, nausea and vomiting.  Genitourinary:  Negative for dysuria, frequency and urgency.  Musculoskeletal:  Positive for back pain and joint pain.  Skin: Negative.  Negative for itching and rash.  Neurological:  Negative for dizziness, tingling, focal weakness, weakness and headaches.  Endo/Heme/Allergies:  Does not bruise/bleed easily.  Psychiatric/Behavioral:  Negative for  depression. The patient is not nervous/anxious and does not have insomnia.      MEDICAL HISTORY:  Past Medical History:  Diagnosis Date   Breast cancer (HCC) 2011   left breast lumpectomy with rad and chemo tx   Personal history of chemotherapy 2011   left breast   Personal history of radiation therapy 2011   left breast   Radiation 2011   S/P chemotherapy, time since greater than 12 weeks 2011    SURGICAL HISTORY: Past Surgical History:  Procedure Laterality Date   BREAST BIOPSY Left 2012   negative   BREAST BIOPSY Left 2011   2 areas removed. Retroaerola area benign.  UOQ was DCIS with positive margin per Dr. Rennie notes.    BREAST BIOPSY Right 12/26/2019   us  bx, Q marker, PREDOMINANTLY SCLEROTIC FIBROUS STROMA WITH RARE ATROPHIC MAMMARY   BREAST BIOPSY Right 01/06/2023   Us  Core Bx. coil clip- path pending   BREAST BIOPSY Right 01/06/2023   US  RT BREAST BX W LOC DEV 1ST LESION IMG BX SPEC US  GUIDE 01/06/2023 [facility]   BREAST LUMPECTOMY Left    SKIN LESION EXCISION  06/2021    SOCIAL HISTORY: Social History   Socioeconomic History   Marital status: Married    Spouse name: Not on file   Number of children: Not on file   Years of education: Not on file   Highest education level: Not on file  Occupational History   Not on file  Tobacco Use   Smoking status: Never   Smokeless tobacco: Never  Vaping Use   Vaping status: Never Used  Substance and Sexual Activity   Alcohol  use: Never   Drug use: Never   Sexual activity: Not on file  Other Topics Concern   Not on file  Social History Narrative   Not on file   Social Drivers of Health   Financial Resource Strain: Low Risk  (07/19/2023)   Received from [facility]   Overall Financial Resource Strain (CARDIA)    Difficulty of Paying Living Expenses: Not hard at all  Food Insecurity: No Food Insecurity (07/19/2023)   Received from [facility]   Hunger Vital Sign     Within the past 12 months, you worried that your food would run out before you got the money to buy more.: Never true    Within the past 12 months, the food you bought just didn't last and you didn't have money to get more.: Never true  Transportation Needs: No Transportation Needs (07/19/2023)   Received from [facility] - Transportation    In the past 12 months, has lack of transportation kept you from medical appointments or from getting medications?: No    Lack of Transportation (Non-Medical): No  Physical Activity: Not on file  Stress: Not on file  Social Connections: Not on file  Intimate Partner Violence: Not At Risk (03/09/2022)   Humiliation, Afraid, Rape, and Kick questionnaire    Fear of Current or Ex-Partner: No    Emotionally Abused: No    Physically Abused: No    Sexually Abused: No    FAMILY HISTORY: Family History  Problem Relation Age of Onset   Breast cancer Neg Hx     ALLERGIES:  is allergic to sulfa antibiotics.  MEDICATIONS:  Current Outpatient Medications  Medication Sig Dispense Refill   acetaminophen (TYLENOL) 325 MG tablet Take 650  mg by mouth every 6 (six) hours as needed.     alendronate  (FOSAMAX ) 70 MG tablet Take by mouth.     amitriptyline  (ELAVIL ) 25 MG tablet Take 25 mg by mouth at bedtime.      amLODipine  (NORVASC ) 2.5 MG tablet Take 2.5 mg by mouth daily.      anastrozole  (ARIMIDEX ) 1 MG tablet TAKE ONE (1) TABLET (1 MG TOTAL) BY MOUTH DAILY. 90 tablet 1   cetirizine (ZYRTEC) 10 MG tablet Take 1 tablet by mouth daily.     ergocalciferol  (VITAMIN D2) 1.25 MG (50000 UT) capsule Take 1 capsule (50,000 Units total) by mouth once a week. 12 capsule 1   mirtazapine (REMERON) 15 MG tablet Take 15 mg by mouth at bedtime.     oxyCODONE  (OXY IR/ROXICODONE ) 5 MG immediate release tablet Take 1 tablet (5 mg total) by mouth every 6 (six) hours as needed for moderate pain. 10 tablet 0   pravastatin  (PRAVACHOL ) 10 MG tablet Take 10  mg by mouth at bedtime.     pregabalin  (LYRICA ) 25 MG capsule Take 1 capsule (25 mg total) by mouth at bedtime. 30 capsule 1   senna-docusate (SENOKOT-S) 8.6-50 MG tablet Take 1 tablet by mouth at bedtime as needed for mild constipation. 30 tablet 0   triamcinolone  cream (KENALOG ) 0.5 % Apply topically as directed. 30 g 1   No current facility-administered medications for this visit.     PHYSICAL EXAMINATION:  Vitals:   02/29/24 1418  BP: 128/73  Pulse: 75  Resp: 19  Temp: (!) 96.8 F (36 C)  SpO2: 98%     Filed Weights   02/29/24 1418  Weight: 151 lb 12.8 oz (68.9 kg)   Righ BREAST exam [in the presence of nurse]- no unusual skin changes.  Approximately 1 cm vague mass noted in the 3 o'clock position mobile.    Physical Exam Vitals and nursing note reviewed.  HENT:     Head: Normocephalic and atraumatic.     Mouth/Throat:     Pharynx: Oropharynx is clear.  Eyes:     Extraocular Movements: Extraocular movements intact.     Pupils: Pupils are equal, round, and reactive to light.  Cardiovascular:     Rate and Rhythm: Normal rate and regular rhythm.  Pulmonary:     Comments: Decreased breath sounds bilaterally.  Abdominal:     Palpations: Abdomen is soft.  Musculoskeletal:        General: Normal range of motion.     Cervical back: Normal range of motion.  Skin:    General: Skin is warm.  Neurological:     General: No focal deficit present.     Mental Status: She is alert and oriented to person, place, and time.  Psychiatric:        Behavior: Behavior normal.        Judgment: Judgment normal.      LABORATORY DATA:  I have reviewed the data as listed Lab Results  Component Value Date   WBC 6.4 02/29/2024   HGB 13.6 02/29/2024   HCT 42.5 02/29/2024   MCV 100.7 (H) 02/29/2024   PLT 178 02/29/2024   Recent Labs    08/23/23 1510 11/23/23 1508 02/29/24 1416  NA 142 139 139  K 4.7 4.3 4.7  CL 106 105 104  CO2 27 25 25   GLUCOSE 93 89 116*  BUN 17 19  13   CREATININE 1.32* 1.42* 1.37*  CALCIUM 9.8 9.4 9.4  GFRNONAA 38* 34* 36*  PROT 7.1 7.1 6.6  ALBUMIN 4.0 3.9 3.9  AST 23 25 30   ALT 22 20 20   ALKPHOS 61 72 69  BILITOT 0.7 0.6 0.5    RADIOGRAPHIC STUDIES: I have personally reviewed the radiological images as listed and agreed with the findings in the report. No results found.   ASSESSMENT & PLAN:   Malignant neoplasm of overlapping sites of right breast in female, estrogen receptor positive (HCC) # JULY 2024- INTACT BREAST PRIMARY [SURGERY NOT DONE]- Right breast cancer stage I- cT1b cN0-ER/PR positive HER2 negative- DECLINED surgery given the risk of anesthesia/surgery [Dr.Cintron].  # US /Mammo- FEB 2nd, 2025-  Stable size of the biopsy-proven malignancy in the RIGHT breast 3 o'clock retroareolar position, measuring up to 11 mm. currently on anastrazole.  Continue anastrozole  for now.  Will repeat mammogram in 6 months.  # BRBPR- ? Dirverticular bleed- awaiting CT scan; Hb 13- stable.   # vit D deficiency- NOV 2024- BMD: OSTEOPOROSIS- T-score of -2.8. continue  ca+vit D; on Fosamax  every 2 weeks given GFR of 37. On fosamax .  #2024 -vitamin D  7.7.  Called in vit D 50K/weekly-  vitamin D  levels-pending.   # Back pain/sciatica [ sep 2023- briefly hospice]- s/p injection-currently improved-  stable.  # Mild to moderate dementia/with episodes of confusion especially in the evenings- stable.  # DISPOSITION: # Diagnostic mammogram/US  in feb 2026 # follow up in 6  months- MD: labs- cbc/cmp; vit D 25 OH  Dr.B     All questions were answered. The patient/family knows to call the clinic with any problems, questions or concerns.    Victoria JONELLE Joe, MD 02/29/2024 3:59 PM

## 2024-03-02 ENCOUNTER — Ambulatory Visit
Admission: RE | Admit: 2024-03-02 | Discharge: 2024-03-02 | Disposition: A | Discharge status: Home / Self Care | Source: Ambulatory Visit | Attending: Nurse Practitioner | Admitting: Nurse Practitioner

## 2024-03-02 DIAGNOSIS — K625 Hemorrhage of anus and rectum: Secondary | ICD-10-CM | POA: Diagnosis present

## 2024-03-02 MED ORDER — IOHEXOL 300 MG/ML  SOLN
80.0000 mL | Freq: Once | INTRAMUSCULAR | Status: AC | PRN
  Administered 2024-03-02: 80 mL via INTRAVENOUS

## 2024-04-24 ENCOUNTER — Other Ambulatory Visit: Payer: Self-pay | Admitting: Internal Medicine

## 2024-07-03 NOTE — Progress Notes (Signed)
 "  Victoria JONELLE Brooklyn, MD [facility], [address]  [place], [place] [address]  Main: [phone] Fax:  [phone] Pager: [phone]   Gastroenterology Consultation  Referring Provider:     Don Lauraine Collar, * Primary Care Physician:  Jeffie Cheryl Therman Mickey., MD Primary Gastroenterologist:  Dr. Corinn JONELLE Prince Reason for Consultation:     Rectal bleeding        HPI:   Victoria Prince is a 89 y.o. female referred by Dr. Jeffie, Cheryl Therman Mickey., MD  for consultation & management of rectal bleeding.  History of Present Illness Victoria Prince is a 89 year old female with prior breast cancer treated with radiation who presents for evaluation of recurrent rectal bleeding.  Rectal bleeding has been ongoing since at least September 2025, with episodes described as heavy at onset and decreasing over approximately five days before stopping. Several days without bleeding typically follow, then the bleeding recurs. Over the past two weeks, she has experienced multiple episodes. Bleeding is bright red and fresh, without associated rectal pain. Episodes often begin upon getting up in the morning, last about five days, and diminish in volume over the last two days of each episode.  Most recent hemoglobin was 13.6 on 02/29/2024.  Her hemoglobin is generally between 14 and 15, normal MCV  A CT scan of the abdomen and pelvis performed after the initial episode in September 2025 did not reveal significant findings except for colonic diverticulosis. She has not had a colonoscopy in at least six years and recalls being told after her last colonoscopy that she would not need another for five years.  Bowel movements are regular without constipation or straining, and stools are not hard. No bloating, gassiness, or weight loss. Her son reports frequent eating and possible weight gain. She denies recent use of medications for constipation or diarrhea.  Patient  is accompanied by her son today  History of breast cancer treated with radiation therapy, without history of pelvic radiation.  NSAIDs: None  Antiplts/Anticoagulants/Anti thrombotics: None  GI Procedures: Colonoscopy more than 5 years ago  Past Medical History:  Diagnosis Date   Allergy    Anxiety    Arthritis    Breast cancer (CMS/HHS-HCC) 12/2022   Environmental allergies    GERD (gastroesophageal reflux disease)    History of cataract    Hyperlipidemia    Hypertension    Lumbar stenosis with neurogenic claudication 01/10/2014   Psoriasis    Varicella     Past Surgical History:  Procedure Laterality Date   Left total hip arthroplasty Left 05/02/14   Breast biopsy bilateral Bilateral    Cataract excision bilateral Bilateral    KNEE ARTHROSCOPY Right    06/22/2007   Left breast lumpectomy Left      Current Outpatient Medications:    acetaminophen (TYLENOL) 325 MG tablet, Take 1 tablet (325 mg total) by mouth every 4 (four) hours as needed for Pain, Disp: , Rfl:    acetaminophen/diphenhydramine (TYLENOL PM ORAL), Take by mouth at bedtime, Disp: , Rfl:    amitriptyline  (ELAVIL ) 25 MG tablet, TAKE (2) TABLETS BY MOUTH EVERY NIGHT AT BEDTIME, Disp: 180 tablet, Rfl: 3   amLODIPine  (NORVASC ) 2.5 MG tablet, TAKE (1) TABLET BY MOUTH EVERY 12 HOURS, Disp: 90 tablet, Rfl: 3   anastrozole  (ARIMIDEX ) 1 mg tablet, Take 1 mg by mouth once daily, Disp: , Rfl:    cetirizine (ZYRTEC) 10 MG tablet, Take 1 tablet (10 mg total) by mouth  once daily, Disp: 90 tablet, Rfl: 1   pravastatin  (PRAVACHOL ) 10 MG tablet, TAKE (1) TABLET BY MOUTH AT BEDTIME, Disp: 90 tablet, Rfl: 1   pregabalin  (LYRICA ) 25 MG capsule, TAKE ONE (1) CAPSULE (25 MG TOTAL) BY MOUTH EVERY 12 (TWELVE) HOURS AS DIRECTED FOR NERVE PAIN, Disp: 60 capsule, Rfl: 0   triamcinolone  0.5 % cream, APPLY TOPICALLY AS DIRECTED (Patient taking differently: Apply topically as needed), Disp: 30 g, Rfl: 1    alendronate  (FOSAMAX ) 70 MG tablet, Take 70 mg by mouth every 7 (seven) days (Patient not taking: Reported on 07/03/2024), Disp: , Rfl:    docusate (COLACE) 50 MG capsule, Take 50 mg by mouth every other day (Patient not taking: Reported on 07/03/2024), Disp: , Rfl:    ergocalciferol , vitamin D2, 1,250 mcg (50,000 unit) capsule, Take 50,000 Units by mouth every 7 (seven) days (Patient not taking: Reported on 07/03/2024), Disp: , Rfl:    LORazepam (ATIVAN) 0.5 MG tablet, Take 0.5 mg by mouth every 12 (twelve) hours as needed for Anxiety (Patient not taking: Reported on 07/03/2024), Disp: , Rfl:    mirtazapine (REMERON) 15 MG tablet, TAKE (1) TABLET BY MOUTH AT BEDTIME (Patient not taking: Reported on 07/03/2024), Disp: 90 tablet, Rfl: 1   oxyCODONE  (ROXICODONE ) 5 MG immediate release tablet, Take 1 tablet (5 mg total) by mouth every 8 (eight) hours as needed for Pain (Patient not taking: Reported on 07/03/2024), Disp: 90 tablet, Rfl: 0   Family History  Problem Relation Name Age of Onset   Cancer Mother     Breast cancer Sister     Cancer Brother       Social History   Tobacco Use   Smoking status: Never   Smokeless tobacco: Never  Vaping Use   Vaping status: Never Used  Substance Use Topics   Alcohol  use: No    Alcohol /week: 0.0 standard drinks of alcohol    Drug use: No    Allergies as of 07/03/2024 - Reviewed 07/03/2024  Allergen Reaction Noted   Sulfa (sulfonamide antibiotics) Rash     Review of Systems:    All systems reviewed and negative except where noted in HPI.   Physical Exam:  BP (!) 146/81 (BP Location: Left upper arm, Patient Position: Sitting, BP Cuff Size: Adult)   Pulse 73   Ht 157.5 cm (5' 2)   Wt 67.6 kg (149 lb)   BMI 27.25 kg/m  No LMP recorded. Patient is postmenopausal.  General:   Alert,  Well-developed, well-nourished, pleasant and cooperative in NAD Eyes:  Sclera clear, no icterus.   Conjunctiva pink. Lungs:  Respirations even and  unlabored.  Clear throughout to auscultation.   No wheezes, crackles, or rhonchi. No acute distress. Heart:  Regular rate and rhythm; no murmurs, clicks, rubs, or gallops. Abdomen:  Normal bowel sounds. Soft, non-tender and non-distended without masses, hepatosplenomegaly or hernias noted.  No guarding or rebound tenderness.   Rectal: Not performed Extremities:  No clubbing or edema.  No cyanosis. Neurologic:  Alert and oriented x3;  grossly normal neurologically. Skin:  Intact without significant lesions or rashes. No jaundice. Psych:  Alert and cooperative. Normal mood and affect.  Imaging Studies: Reviewed  Assessment and Plan:   Victoria Prince is a 89 y.o. female with history of breast cancer, s/p radiation, hypertension, hyperlipidemia is seen in consultation for painless bright red blood per rectum since 02/2024 Assessment & Plan Rectal bleeding, hemodynamically insignificant bleed with no evidence of anemia Recurrent, intermittent bright red  rectal bleeding of unclear etiology. Differential includes hemorrhoids, polyps, and colorectal malignancy. Neoplastic and structural causes prioritized due to age and symptom chronicity. - Scheduled flexible sigmoidoscopy at [facility] within 1-2 weeks. - Planned half-dose Miralax bowel preparation prior to procedure. - Discussed anesthesia options; may proceed without anesthesia if preferred. - Perform biopsy or polypectomy during procedure if indicated. - Refer to advanced endoscopist if large polyp identified. - Initiate office-based treatment if hemorrhoids identified. - Communicate results and further management post-procedure.  Follow up after above workup   Victoria JONELLE Brooklyn, MD  "

## 2024-07-06 ENCOUNTER — Ambulatory Visit: Admitting: Certified Registered"

## 2024-07-06 ENCOUNTER — Ambulatory Visit
Admission: RE | Admit: 2024-07-06 | Discharge: 2024-07-06 | Disposition: A | Discharge status: Home / Self Care | Attending: Gastroenterology | Admitting: Gastroenterology

## 2024-07-06 ENCOUNTER — Encounter: Admission: RE | Disposition: A | Payer: Self-pay | Attending: Gastroenterology

## 2024-07-06 ENCOUNTER — Encounter: Payer: Self-pay | Admitting: Gastroenterology

## 2024-07-06 DIAGNOSIS — D125 Benign neoplasm of sigmoid colon: Secondary | ICD-10-CM | POA: Insufficient documentation

## 2024-07-06 DIAGNOSIS — K5730 Diverticulosis of large intestine without perforation or abscess without bleeding: Secondary | ICD-10-CM | POA: Insufficient documentation

## 2024-07-06 DIAGNOSIS — K644 Residual hemorrhoidal skin tags: Secondary | ICD-10-CM | POA: Insufficient documentation

## 2024-07-06 DIAGNOSIS — K625 Hemorrhage of anus and rectum: Secondary | ICD-10-CM | POA: Diagnosis present

## 2024-07-06 HISTORY — PX: POLYPECTOMY: SHX149

## 2024-07-06 HISTORY — PX: FLEXIBLE SIGMOIDOSCOPY: SHX5431

## 2024-07-06 MED ORDER — LIDOCAINE HCL (PF) 2 % IJ SOLN
INTRAMUSCULAR | Status: DC | PRN
  Administered 2024-07-06: 40 mg via INTRADERMAL

## 2024-07-06 MED ORDER — PROPOFOL 10 MG/ML IV BOLUS
INTRAVENOUS | Status: DC | PRN
  Administered 2024-07-06: 60 ug/kg/min via INTRAVENOUS

## 2024-07-06 MED ORDER — SODIUM CHLORIDE 0.9 % IV SOLN
INTRAVENOUS | Status: DC
  Administered 2024-07-06: 500 mL via INTRAVENOUS

## 2024-07-06 NOTE — Op Note (Signed)
 [facility] Patient Name: Victoria Prince Procedure Date: 07/06/2024 1:13 PM MRN: [id number] Account #: [id number] Date of Birth: 1928/12/23 Admit Type: Outpatient Age: 89 Room: [facility] ENDO ROOM 3 Gender: Female Note Status: Finalized Instrument Name: Upper GI Scope [id number] Procedure:             Flexible Sigmoidoscopy Indications:           Rectal hemorrhage Providers:             Corinn Jess Brooklyn MD, MD Referring MD:          Cheryl CHARLENA Jericho (Referring MD) Medicines:             General Anesthesia Complications:         No immediate complications. Estimated blood loss: None. Procedure:             Pre-Anesthesia Assessment:                        - Prior to the procedure, a History and Physical was                         performed, and patient medications and allergies were                         reviewed. The patient is competent. The risks and                         benefits of the procedure and the sedation options and                         risks were discussed with the patient. All questions                         were answered and informed consent was obtained.                         Patient identification and proposed procedure were                         verified by the physician, the nurse, the                         anesthesiologist, the anesthetist and the technician                         in the pre-procedure area in the procedure room in the                         endoscopy suite. Mental Status Examination: alert and                         oriented. Airway Examination: normal oropharyngeal                         airway and neck mobility. Respiratory Examination:                         clear to auscultation. CV Examination: normal.  Prophylactic Antibiotics: The patient does not require                         prophylactic antibiotics. Prior Anticoagulants: The                         patient  has taken no anticoagulant or antiplatelet                         agents. ASA Grade Assessment: III - A patient with                         severe systemic disease. After reviewing the risks and                         benefits, the patient was deemed in satisfactory                         condition to undergo the procedure. The anesthesia                         plan was to use general anesthesia. Immediately prior                         to administration of medications, the patient was                         re-assessed for adequacy to receive sedatives. The                         heart rate, respiratory rate, oxygen saturations,                         blood pressure, adequacy of pulmonary ventilation, and                         response to care were monitored throughout the                         procedure. The physical status of the patient was                         re-assessed after the procedure.                        After obtaining informed consent, the scope was passed                         under direct vision. The Endoscope was introduced                         through the anus and advanced to the the descending                         colon. The flexible sigmoidoscopy was accomplished                         without difficulty. The patient tolerated the  procedure well. The quality of the bowel preparation                         was fair. Findings:      Hemorrhoids were found on perianal exam.      Two sessile polyps were found in the sigmoid colon. The polyps were 3 to       4 mm in size. These polyps were removed with a cold snare. Resection and       retrieval were complete. Estimated blood loss: none.      Multiple small-mouthed diverticula were found in the sigmoid colon and       descending colon.      Non-bleeding external and internal hemorrhoids were found during       retroflexion. The hemorrhoids were large. Impression:             - Preparation of the colon was fair.                        - Hemorrhoids found on perianal exam.                        - Two 3 to 4 mm polyps in the sigmoid colon, removed                         with a cold snare. Resected and retrieved.                        - Diverticulosis in the sigmoid colon and in the                         descending colon.                        - Non-bleeding external and internal hemorrhoids. Recommendation:        - Discharge patient to home (with escort).                        - Resume previous diet today.                        - Await pathology results.                        - Continue present medications. Procedure Code(s):     --- Professional ---                        [phone], Sigmoidoscopy, flexible; with removal of                         tumor(s), polyp(s), or other lesion(s) by snare                         technique Diagnosis Code(s):     --- Professional ---                        K64.4, Residual hemorrhoidal skin tags                        D12.5, Benign  neoplasm of sigmoid colon                        K62.5, Hemorrhage of anus and rectum                        K57.30, Diverticulosis of large intestine without                         perforation or abscess without bleeding CPT copyright 2022 [employer]. All rights reserved. The codes documented in this report are preliminary and upon coder review may  be revised to meet current compliance requirements. Dr. Corinn Brooklyn Corinn Jess Brooklyn MD, MD 07/06/2024 1:49:49 PM This report has been signed electronically. Number of Addenda: 0 Note Initiated On: 07/06/2024 1:13 PM Total Procedure Duration: 0 hours 15 minutes 0 seconds  Estimated Blood Loss:  Estimated blood loss: none. Estimated blood loss: none.      [facility]

## 2024-07-06 NOTE — Anesthesia Preprocedure Evaluation (Signed)
"                                    Anesthesia Evaluation  Patient identified by MRN, date of birth, ID band Patient awake    Reviewed: Allergy & Precautions, H&P , NPO status , Patient's Chart, lab work & pertinent test results  Airway Mallampati: II  TM Distance: >3 FB Neck ROM: Full    Dental no notable dental hx. (+) Upper Dentures, Lower Dentures   Pulmonary neg pulmonary ROS   Pulmonary exam normal breath sounds clear to auscultation       Cardiovascular negative cardio ROS Normal cardiovascular exam Rhythm:Regular Rate:Normal     Neuro/Psych negative neurological ROS  negative psych ROS   GI/Hepatic negative GI ROS, Neg liver ROS,,,  Endo/Other  negative endocrine ROS    Renal/GU negative Renal ROS  negative genitourinary   Musculoskeletal negative musculoskeletal ROS (+)    Abdominal   Peds negative pediatric ROS (+)  Hematology negative hematology ROS (+)   Anesthesia Other Findings   Reproductive/Obstetrics negative OB ROS                              Anesthesia Physical Anesthesia Plan  ASA: 3  Anesthesia Plan: General   Post-op Pain Management:    Induction: Intravenous  PONV Risk Score and Plan:   Airway Management Planned:   Additional Equipment:   Intra-op Plan:   Post-operative Plan: Extubation in OR  Informed Consent: I have reviewed the patients History and Physical, chart, labs and discussed the procedure including the risks, benefits and alternatives for the proposed anesthesia with the patient or authorized representative who has indicated his/her understanding and acceptance.     Dental advisory given  Plan Discussed with: CRNA  Anesthesia Plan Comments:         Anesthesia Quick Evaluation  "

## 2024-07-06 NOTE — Transfer of Care (Signed)
 Immediate Anesthesia Transfer of Care Note  Patient: Victoria Prince  Procedure(s) Performed: SIGMOIDOSCOPY, FLEXIBLE POLYPECTOMY, INTESTINE  Patient Location: PACU and Endoscopy Unit  Anesthesia Type:MAC  Level of Consciousness: drowsy  Airway & Oxygen Therapy: Patient Spontanous Breathing and Patient connected to nasal cannula oxygen  Post-op Assessment: Report given to RN and Post -op Vital signs reviewed and stable  Post vital signs: Reviewed and stable  Last Vitals:  Vitals Value Taken Time  BP 129/57 07/06/24 13:50  Temp    Pulse 61 07/06/24 13:50  Resp 22 07/06/24 13:50  SpO2 97 % 07/06/24 13:50    Last Pain:  Vitals:   07/06/24 1350  TempSrc:   PainSc: Asleep         Complications: No notable events documented.

## 2024-07-06 NOTE — Anesthesia Postprocedure Evaluation (Signed)
"   Anesthesia Post Note  Patient: Victoria Prince  Procedure(s) Performed: SIGMOIDOSCOPY, FLEXIBLE POLYPECTOMY, INTESTINE  Patient location during evaluation: Endoscopy Anesthesia Type: General Level of consciousness: awake and alert Pain management: pain level controlled Vital Signs Assessment: post-procedure vital signs reviewed and stable Respiratory status: spontaneous breathing, nonlabored ventilation, respiratory function stable and patient connected to nasal cannula oxygen Cardiovascular status: blood pressure returned to baseline and stable Postop Assessment: no apparent nausea or vomiting Anesthetic complications: no   No notable events documented.   Last Vitals:  Vitals:   07/06/24 1359 07/06/24 1406  BP: (!) 117/102 132/80  Pulse: 65 64  Resp: 18 18  Temp:    SpO2: 98% 100%    Last Pain:  Vitals:   07/06/24 1359  TempSrc:   PainSc: 0-No pain                 Fairy A Willistine Ferrall      "

## 2024-07-06 NOTE — H&P (Signed)
 "   Victoria JONELLE Brooklyn, MD [facility], DHIP [address]  [place], [place] [address]  Main: [phone] Fax:  [phone] Pager: [phone]   Primary Care Physician:  Jeffie Cheryl BRAVO, MD Primary Gastroenterologist:  Dr. Corinn JONELLE Prince  Pre-Procedure History & Physical: HPI:  Victoria Prince is a 89 y.o. female is here for an flexible sigmoidoscopy.   Past Medical History:  Diagnosis Date   Breast cancer (HCC) 2011   left breast lumpectomy with rad and chemo tx   Personal history of chemotherapy 2011   left breast   Personal history of radiation therapy 2011   left breast   Radiation 2011   S/P chemotherapy, time since greater than 12 weeks 2011    Past Surgical History:  Procedure Laterality Date   BREAST BIOPSY Left 2012   negative   BREAST BIOPSY Left 2011   2 areas removed. Retroaerola area benign.  UOQ was DCIS with positive margin per Dr. Rennie notes.    BREAST BIOPSY Right 12/26/2019   us  bx, Q marker, PREDOMINANTLY SCLEROTIC FIBROUS STROMA WITH RARE ATROPHIC MAMMARY   BREAST BIOPSY Right 01/06/2023   Us  Core Bx. coil clip- path pending   BREAST BIOPSY Right 01/06/2023   US  RT BREAST BX W LOC DEV 1ST LESION IMG BX SPEC US  GUIDE 01/06/2023 [facility]   BREAST LUMPECTOMY Left    SKIN LESION EXCISION  06/2021    Prior to Admission medications  Medication Sig Start Date End Date Taking? Authorizing Provider  acetaminophen (TYLENOL) 325 MG tablet Take 650 mg by mouth every 6 (six) hours as needed.    [provider]  alendronate  (FOSAMAX ) 70 MG tablet Take by mouth. 11/01/23   [provider]  amitriptyline  (ELAVIL ) 25 MG tablet Take 25 mg by mouth at bedtime.     [provider]  amLODipine  (NORVASC ) 2.5 MG tablet Take 2.5 mg by mouth daily.     [provider]  anastrozole  (ARIMIDEX ) 1 MG tablet TAKE ONE (1) TABLET (1 MG TOTAL) BY MOUTH DAILY. 04/24/24   Brahmanday, Govinda R, MD   cetirizine (ZYRTEC) 10 MG tablet Take 1 tablet by mouth daily. 11/25/18   [provider]  ergocalciferol  (VITAMIN D2) 1.25 MG (50000 UT) capsule Take 1 capsule (50,000 Units total) by mouth once a week. 05/13/23   Brahmanday, Govinda R, MD  mirtazapine (REMERON) 15 MG tablet Take 15 mg by mouth at bedtime.    [provider]  oxyCODONE  (OXY IR/ROXICODONE ) 5 MG immediate release tablet Take 1 tablet (5 mg total) by mouth every 6 (six) hours as needed for moderate pain. 03/12/22   Patel, Sona, MD  pravastatin  (PRAVACHOL ) 10 MG tablet Take 10 mg by mouth at bedtime. 11/01/23   [provider]  pregabalin  (LYRICA ) 25 MG capsule Take 1 capsule (25 mg total) by mouth at bedtime. 03/12/22   Patel, Sona, MD  senna-docusate (SENOKOT-S) 8.6-50 MG tablet Take 1 tablet by mouth at bedtime as needed for mild constipation. 03/12/22   Patel, Sona, MD  triamcinolone  cream (KENALOG ) 0.5 % Apply topically as directed. 05/12/23   Rennie Cindy JONELLE, MD    Allergies as of 07/03/2024 - Reviewed 03/02/2024  Allergen Reaction Noted   Sulfa antibiotics Rash 01/08/2015    Family History  Problem Relation Age of Onset   Breast cancer Neg Hx     Social History   Socioeconomic History   Marital status: Married    Spouse name: Not on file  Number of children: Not on file   Years of education: Not on file   Highest education level: Not on file  Occupational History   Not on file  Tobacco Use   Smoking status: Never   Smokeless tobacco: Never  Vaping Use   Vaping status: Never Used  Substance and Sexual Activity   Alcohol  use: Never   Drug use: Never   Sexual activity: Not on file  Other Topics Concern   Not on file  Social History Narrative   Not on file   Social Drivers of Health   Tobacco Use: Low Risk (07/06/2024)   Patient History    Smoking Tobacco Use: Never    Smokeless Tobacco Use: Never    Passive Exposure: Not on file  Financial Resource Strain: Low Risk   (07/19/2023)   Received from [facility]   Overall Financial Resource Strain (CARDIA)    Difficulty of Paying Living Expenses: Not hard at all  Food Insecurity: No Food Insecurity (07/19/2023)   Received from [facility]   Epic    Within the past 12 months, you worried that your food would run out before you got the money to buy more.: Never true    Within the past 12 months, the food you bought just didn't last and you didn't have money to get more.: Never true  Transportation Needs: No Transportation Needs (07/19/2023)   Received from [facility]    In the past 12 months, has lack of transportation kept you from medical appointments or from getting medications?: No    Lack of Transportation (Non-Medical): No  Physical Activity: Not on file  Stress: Not on file  Social Connections: Not on file  Intimate Partner Violence: Not At Risk (03/09/2022)   Humiliation, Afraid, Rape, and Kick questionnaire    Fear of Current or Ex-Partner: No    Emotionally Abused: No    Physically Abused: No    Sexually Abused: No  Depression (PHQ2-9): Not on file  Alcohol  Screen: Not on file  Housing: Low Risk  (07/03/2024)   Received from [facility]   Epic    In the last 12 months, was there a time when you were not able to pay the mortgage or rent on time?: No    In the past 12 months, how many times have you moved where you were living?: 0    At any time in the past 12 months, were you homeless or living in a shelter (including now)?: No  Utilities: Not At Risk (07/19/2023)   Received from [facility]    Threatened with loss of utilities: No  Health Literacy: Not on file    Review of Systems: See HPI, otherwise negative ROS  Physical Exam: BP (!) 147/72   Pulse 79   Temp (!) 97.5 F (36.4 C) (Temporal)   Resp 16   Ht 5' 2 (1.575 m)   Wt 66.1 kg   SpO2 99%   BMI  26.67 kg/m  General:   Alert,  pleasant and cooperative in NAD Head:  Normocephalic and atraumatic. Neck:  Supple; no masses or thyromegaly. Lungs:  Clear throughout to auscultation.    Heart:  Regular rate and rhythm. Abdomen:  Soft, nontender and nondistended. Normal bowel sounds, without guarding, and without rebound.   Neurologic:  Alert and  oriented x4;  grossly normal neurologically.  Impression/Plan: Victoria Prince is here for an flexible sigmoidoscopy to be performed for rectal bleeding  Risks, benefits, limitations, and alternatives regarding  flexible sigmoidoscopy have been reviewed with the patient.  Questions have been answered.  All parties agreeable.   Victoria Brooklyn, MD  07/06/2024, 1:16 PM "

## 2024-07-07 LAB — SURGICAL PATHOLOGY

## 2024-07-11 ENCOUNTER — Ambulatory Visit: Payer: Self-pay | Admitting: Gastroenterology

## 2024-07-27 ENCOUNTER — Ambulatory Visit
Admission: RE | Admit: 2024-07-27 | Discharge: 2024-07-27 | Disposition: A | Source: Ambulatory Visit | Attending: Internal Medicine

## 2024-07-27 DIAGNOSIS — C50811 Malignant neoplasm of overlapping sites of right female breast: Secondary | ICD-10-CM

## 2024-08-28 ENCOUNTER — Ambulatory Visit: Admitting: Internal Medicine

## 2024-08-28 ENCOUNTER — Other Ambulatory Visit
